# Patient Record
Sex: Male | Born: 1950 | Race: White | Hispanic: No | Marital: Married | Smoking: Former smoker
Health system: Southern US, Community
[De-identification: ages and names within clinical notes are randomized; demographics above are authoritative.]

## PROBLEM LIST (undated history)

## (undated) DIAGNOSIS — S0300XA Dislocation of jaw, unspecified side, initial encounter: Secondary | ICD-10-CM

## (undated) DIAGNOSIS — I10 Essential (primary) hypertension: Secondary | ICD-10-CM

## (undated) DIAGNOSIS — M1990 Unspecified osteoarthritis, unspecified site: Secondary | ICD-10-CM

## (undated) DIAGNOSIS — E785 Hyperlipidemia, unspecified: Secondary | ICD-10-CM

## (undated) DIAGNOSIS — H9319 Tinnitus, unspecified ear: Secondary | ICD-10-CM

## (undated) DIAGNOSIS — K219 Gastro-esophageal reflux disease without esophagitis: Secondary | ICD-10-CM

## (undated) HISTORY — PX: EYE SURGERY: SHX253

## (undated) HISTORY — PX: TONSILECTOMY, ADENOIDECTOMY, BILATERAL MYRINGOTOMY AND TUBES: SHX2538

## (undated) HISTORY — DX: Tinnitus, unspecified ear: H93.19

## (undated) HISTORY — PX: TONSILLECTOMY: SUR1361

## (undated) HISTORY — DX: Essential (primary) hypertension: I10

## (undated) HISTORY — DX: Unspecified osteoarthritis, unspecified site: M19.90

## (undated) HISTORY — PX: JOINT REPLACEMENT: SHX530

## (undated) HISTORY — DX: Gastro-esophageal reflux disease without esophagitis: K21.9

## (undated) HISTORY — DX: Dislocation of jaw, unspecified side, initial encounter: S03.00XA

## (undated) HISTORY — DX: Hyperlipidemia, unspecified: E78.5

---

## 2007-07-30 ENCOUNTER — Ambulatory Visit: Payer: Self-pay | Admitting: Family Medicine

## 2007-07-30 DIAGNOSIS — I10 Essential (primary) hypertension: Secondary | ICD-10-CM | POA: Insufficient documentation

## 2007-07-30 DIAGNOSIS — K219 Gastro-esophageal reflux disease without esophagitis: Secondary | ICD-10-CM | POA: Insufficient documentation

## 2007-08-07 ENCOUNTER — Encounter: Payer: Self-pay | Admitting: Family Medicine

## 2007-08-08 ENCOUNTER — Encounter: Payer: Self-pay | Admitting: Family Medicine

## 2007-08-08 DIAGNOSIS — E785 Hyperlipidemia, unspecified: Secondary | ICD-10-CM | POA: Insufficient documentation

## 2007-08-08 LAB — CONVERTED CEMR LAB
AST: 30 units/L (ref 0–37)
Albumin: 4.5 g/dL (ref 3.5–5.2)
Alkaline Phosphatase: 77 units/L (ref 39–117)
BUN: 16 mg/dL (ref 6–23)
HDL goal, serum: 40 mg/dL
HDL: 37 mg/dL — ABNORMAL LOW (ref >39)
LDL Cholesterol: 142 mg/dL — ABNORMAL HIGH (ref 0–99)
Potassium: 4.2 meq/L (ref 3.5–5.3)
TSH: 1.607 microintl units/mL (ref 0.350–5.50)
Total Bilirubin: 0.4 mg/dL (ref 0.3–1.2)
Total CHOL/HDL Ratio: 5.9
Triglycerides: 192 mg/dL — ABNORMAL HIGH (ref <150)
VLDL: 38 mg/dL (ref 0–40)

## 2007-08-12 ENCOUNTER — Telehealth: Payer: Self-pay | Admitting: Family Medicine

## 2007-08-29 ENCOUNTER — Telehealth: Payer: Self-pay | Admitting: Family Medicine

## 2007-08-31 LAB — HM COLONOSCOPY: HM Colonoscopy: NORMAL

## 2007-09-10 ENCOUNTER — Encounter: Payer: Self-pay | Admitting: Family Medicine

## 2008-02-05 ENCOUNTER — Ambulatory Visit: Payer: Self-pay | Admitting: Family Medicine

## 2008-03-17 ENCOUNTER — Ambulatory Visit: Payer: Self-pay | Admitting: Family Medicine

## 2008-03-17 DIAGNOSIS — M25519 Pain in unspecified shoulder: Secondary | ICD-10-CM | POA: Insufficient documentation

## 2008-03-29 ENCOUNTER — Encounter: Payer: Self-pay | Admitting: Family Medicine

## 2008-04-30 ENCOUNTER — Telehealth: Payer: Self-pay | Admitting: Family Medicine

## 2008-05-11 ENCOUNTER — Encounter: Payer: Self-pay | Admitting: Family Medicine

## 2008-06-16 ENCOUNTER — Ambulatory Visit: Payer: Self-pay | Admitting: Family Medicine

## 2008-06-17 ENCOUNTER — Encounter: Payer: Self-pay | Admitting: Family Medicine

## 2008-06-17 ENCOUNTER — Encounter: Admission: RE | Admit: 2008-06-17 | Discharge: 2008-06-17 | Payer: Self-pay | Admitting: Family Medicine

## 2008-06-21 LAB — CONVERTED CEMR LAB
BUN: 16 mg/dL (ref 6–23)
CO2: 24 meq/L (ref 19–32)
Calcium: 9.6 mg/dL (ref 8.4–10.5)
Chloride: 104 meq/L (ref 96–112)
Cholesterol: 152 mg/dL (ref 0–200)
Creatinine, Ser: 1.07 mg/dL (ref 0.40–1.50)
Glucose, Bld: 108 mg/dL — ABNORMAL HIGH (ref 70–99)
Total Bilirubin: 0.4 mg/dL (ref 0.3–1.2)
Total CHOL/HDL Ratio: 3.2
Triglycerides: 136 mg/dL (ref <150)
VLDL: 27 mg/dL (ref 0–40)

## 2008-06-23 ENCOUNTER — Encounter: Payer: Self-pay | Admitting: Family Medicine

## 2008-06-30 ENCOUNTER — Encounter: Admission: RE | Admit: 2008-06-30 | Discharge: 2008-07-21 | Payer: Self-pay | Admitting: Sports Medicine

## 2008-10-16 ENCOUNTER — Ambulatory Visit: Payer: Self-pay | Admitting: Family Medicine

## 2008-10-16 DIAGNOSIS — J069 Acute upper respiratory infection, unspecified: Secondary | ICD-10-CM | POA: Insufficient documentation

## 2008-10-16 DIAGNOSIS — R059 Cough, unspecified: Secondary | ICD-10-CM | POA: Insufficient documentation

## 2008-10-16 DIAGNOSIS — I10 Essential (primary) hypertension: Secondary | ICD-10-CM | POA: Insufficient documentation

## 2008-10-16 DIAGNOSIS — R05 Cough: Secondary | ICD-10-CM

## 2009-02-24 ENCOUNTER — Ambulatory Visit: Payer: Self-pay | Admitting: Family Medicine

## 2009-03-01 ENCOUNTER — Encounter: Payer: Self-pay | Admitting: Family Medicine

## 2009-03-03 LAB — CONVERTED CEMR LAB
AST: 33 units/L (ref 0–37)
Albumin: 4.1 g/dL (ref 3.5–5.2)
Alkaline Phosphatase: 62 units/L (ref 39–117)
BUN: 16 mg/dL (ref 6–23)
Calcium: 9.2 mg/dL (ref 8.4–10.5)
Creatinine, Ser: 1.01 mg/dL (ref 0.40–1.50)
Glucose, Bld: 103 mg/dL — ABNORMAL HIGH (ref 70–99)
HDL: 34 mg/dL — ABNORMAL LOW (ref >39)
LDL Cholesterol: 121 mg/dL — ABNORMAL HIGH (ref 0–99)
PSA: 1.04 ng/mL (ref 0.10–4.00)
Potassium: 4.9 meq/L (ref 3.5–5.3)
Total CHOL/HDL Ratio: 5.7
Triglycerides: 188 mg/dL — ABNORMAL HIGH (ref <150)

## 2010-06-05 ENCOUNTER — Ambulatory Visit: Payer: Self-pay | Admitting: Emergency Medicine

## 2010-06-05 DIAGNOSIS — J209 Acute bronchitis, unspecified: Secondary | ICD-10-CM | POA: Insufficient documentation

## 2010-11-13 ENCOUNTER — Encounter: Payer: Self-pay | Admitting: Family Medicine

## 2010-11-21 NOTE — Assessment & Plan Note (Signed)
Summary: Cough-creamy, runny nose, chest congestion x 3 dys rm 3   Vital Signs:  Patient Profile:   60 Years Old Male CC:      Cold & URI symptoms Height:     76 inches Weight:      227 pounds O2 Sat:      98 % O2 treatment:    Room Air Temp:     98.6 degrees F oral Pulse rate:   68 / minute Pulse rhythm:   regular Resp:     16 per minute BP sitting:   154 / 90  (left arm) Cuff size:   regular  Vitals Entered By: Areta Haber CMA (June 05, 2010 3:37 PM)                  Current Allergies: DEMEROL ZESTORETIC (LISINOPRIL-HYDROCHLOROTHIAZIDE)History of Present Illness Chief Complaint: Cold & URI symptoms History of Present Illness: Chest cold for 3 days.  Was recently on a business trip in [place] in very hot weather.  Chest congestion, yellow sputum, only a very mild URI symptoms, no F/C/N/V.  +cough.  He has a h/o CAP about 3 years ago tx outpt with ABX.  He has also been working a lot these past 2 weeks.  Current Problems: BRONCHITIS, ACUTE (ICD-466.0) HEALTH MAINTENANCE EXAM (ICD-V70.0) URI (ICD-465.9) COUGH (ICD-786.2) HYPERTENSION (ICD-401.9) SHOULDER PAIN, RIGHT (ICD-719.41) HYPERLIPIDEMIA NEC/NOS (ICD-272.4) GERD (ICD-530.81) HYPERTENSION, BENIGN ESSENTIAL (ICD-401.1) SCREENING FOR MALIGNANT NEOPLASM, COLON (ICD-V76.51)   Current Meds PRILOSEC OTC 20 MG  TBEC (OMEPRAZOLE MAGNESIUM)  FISH OIL 1000 MG  CAPS (OMEGA-3 FATTY ACIDS) Take 1 tablet by mouth three times a day BENICAR 20 MG TABS (OLMESARTAN MEDOXOMIL) Take 1 tablet by mouth once a day FLAX SEED OIL 1000 MG CAPS (FLAXSEED (LINSEED))  MULTIVITAMINS  TABS (MULTIPLE VITAMIN) one a day MUCINEX 600 MG XR12H-TAB (GUAIFENESIN) as directed ZITHROMAX Z-PAK 250 MG TABS (AZITHROMYCIN) use as directed TESSALON 200 MG CAPS (BENZONATATE) 1 tab by mouth three times a day as needed for cough  REVIEW OF SYSTEMS Constitutional Symptoms      Denies fever, chills, night sweats, weight loss, weight gain, and  fatigue.  Eyes       Denies change in vision, eye pain, eye discharge, glasses, contact lenses, and eye surgery. Ear/Nose/Throat/Mouth       Complains of frequent runny nose.      Denies hearing loss/aids, change in hearing, ear pain, ear discharge, dizziness, frequent nose bleeds, sinus problems, sore throat, hoarseness, and tooth pain or bleeding.  Respiratory       Complains of productive cough.      Denies dry cough, wheezing, shortness of breath, asthma, bronchitis, and emphysema/COPD.      Comments: chest congestion Cardiovascular       Denies murmurs, chest pain, and tires easily with exhertion.    Gastrointestinal       Denies stomach pain, nausea/vomiting, diarrhea, constipation, blood in bowel movements, and indigestion. Genitourniary       Denies painful urination, kidney stones, and loss of urinary control. Neurological       Denies paralysis, seizures, and fainting/blackouts. Musculoskeletal       Denies muscle pain, joint pain, joint stiffness, decreased range of motion, redness, swelling, muscle weakness, and gout.  Skin       Denies bruising, unusual mles/lumps or sores, and hair/skin or nail changes.  Psych       Denies mood changes, temper/anger issues, anxiety/stress, speech problems, depression, and sleep problems. Other Comments: creamy  x 3 dys. Pt states he's been out of town for a week as well. Pt has not seen his PCP for this.   Past History:  Past Medical History: Last updated: 02/24/2009 Hx of TMJ with jaw dislocation.  Chronic ear ringing for 20 years.  Hypertension borderline choleterol  Spirometry 02/2009: FVC 77%, FEV1 74%, FEV1% is 78%, FEV1 inc by 15%, FEF 25-75 inc by 48%.    Past Surgical History: Last updated: 07/30/2007 tonsillectomy as a child.   Family History: Last updated: 10/16/2008 Mother with HTN, and cholesterol. Also had congenital kidney disease that let dialysis in her 32s.  Passed in her 20s from complications of kidney dz.     father alive - bypass in 2000  Social History: Last updated: 10/16/2008 Occupational hygienist for [employer].  Married to [place] with 3 adult children.  Youngest daughter still lives at home.  Former Smoker - smoked for 3 years and stopped at age 60 Alcohol use-yes - 12 drinks per week Drug use-no Regular exercise-no  Risk Factors: Alcohol Use: 2 (07/30/2007) Caffeine Use: 2 (07/30/2007) Exercise: no (07/30/2007)  Risk Factors: Smoking Status: quit (07/30/2007)  Physical Exam General appearance: well developed, well nourished, no acute distress Ears: normal, no lesions or deformities Nasal: mucosa pink, nonedematous, no septal deviation, turbinates normal Oral/Pharynx: tongue normal, posterior pharynx without erythema or exudate Chest/Lungs: no rales, wheezes, or rhonchi bilateral, breath sounds equal without effort Heart: regular rate and  rhythm, no murmur Skin: no obvious rashes or lesions MSE: oriented to time, place, and person Assessment New Problems: BRONCHITIS, ACUTE (ICD-466.0)   Plan New Medications/Changes: TESSALON 200 MG CAPS (BENZONATATE) 1 tab by mouth three times a day as needed for cough  #21 x 0, 06/05/2010, Hoyt Koch MD ZITHROMAX Z-PAK 250 MG TABS (AZITHROMYCIN) use as directed  #1 x 0, 06/05/2010, Hoyt Koch MD  New Orders: Est. Patient Level II [phone] Planning Comments:   Mucinex OTC twice a day, Increase water intake, If getting more severe, Follow-up with your primary care physician for possible chest Xray.  Also, Follow-up with your primary care physician to discuss your high blood pressure.  You should go check it at the pharmacy occasionally and write down the numbers so that you can show Dr. Darra Lis in case you need to restart your BP meds.   The patient and/or caregiver has been counseled thoroughly with regard to medications prescribed including dosage, schedule, interactions, rationale for use, and possible side effects and they  verbalize understanding.  Diagnoses and expected course of recovery discussed and will return if not improved as expected or if the condition worsens. Patient and/or caregiver verbalized understanding.  Prescriptions: TESSALON 200 MG CAPS (BENZONATATE) 1 tab by mouth three times a day as needed for cough  #21 x 0   Entered and Authorized by:   Hoyt Koch MD   Signed by:   Hoyt Koch MD on 06/05/2010   Method used:   Print then Give to Patient   RxID:   [id number] ZITHROMAX Z-PAK 250 MG TABS (AZITHROMYCIN) use as directed  #1 x 0   Entered and Authorized by:   Hoyt Koch MD   Signed by:   Hoyt Koch MD on 06/05/2010   Method used:   Print then Give to Patient   RxID:   [id number]   Orders Added: 1)  Est. Patient Level II [29528]

## 2011-03-07 ENCOUNTER — Encounter: Payer: Self-pay | Admitting: Family Medicine

## 2011-03-07 ENCOUNTER — Ambulatory Visit (INDEPENDENT_AMBULATORY_CARE_PROVIDER_SITE_OTHER): Payer: 59 | Admitting: Family Medicine

## 2011-03-07 VITALS — BP 157/97 | HR 74 | Ht 74.5 in | Wt 228.0 lb

## 2011-03-07 DIAGNOSIS — R209 Unspecified disturbances of skin sensation: Secondary | ICD-10-CM

## 2011-03-07 DIAGNOSIS — R202 Paresthesia of skin: Secondary | ICD-10-CM

## 2011-03-07 DIAGNOSIS — M79609 Pain in unspecified limb: Secondary | ICD-10-CM

## 2011-03-07 DIAGNOSIS — M546 Pain in thoracic spine: Secondary | ICD-10-CM

## 2011-03-07 MED ORDER — OLMESARTAN MEDOXOMIL 20 MG PO TABS: 20.0000 mg | ORAL_TABLET | Freq: Every day | ORAL | Status: DC

## 2011-03-07 NOTE — Progress Notes (Signed)
  Subjective:    Patient ID: Glenn Meyer, male    DOB: 10-02-1951, 60 y.o.   MRN: [id number]  HPI Neat the left shoulder blade had a twinge while out of town in [place].  Woke up started next morning to stretch and thin intense pain that brought him to his knees. Says started sweating with pain.  Saw an doc there.  Told it was muscle spasm and given a shot.  24 hours later says he is numb from his inner elbow to his inner  wrist and the 4th and 5th digit on the left hand are numb as well.  Also notes dec strength in the hand as well.  Was on hydrocodone initially. He is on an anti-inflammatory.  His actual back pain is much better. No trauma to upper back. No prior injuries.   Stopped his BP meds. He stopped his blood pressure meds because he been monitoring his blood pressure and it has been well-controlled. He has been off of them for almost a year. He did not realize that his blood pressure had started to creep back up. No chest pain or shortness of breath. He did not have any side effects or problems with his last medication. Review of Systems     Objective:   Physical Exam  Constitutional: He appears well-developed and well-nourished.  HENT:  Head: Normocephalic and atraumatic.  Cardiovascular: Normal rate, regular rhythm and normal heart sounds.   Pulmonary/Chest: Effort normal and breath sounds normal.  Musculoskeletal:       Neck with NROM. Mild tenderness along the medial border of the scapula.  Left shoulder with NROM.  Strength 5/5in the shoulder, elbow, and wrist.  Neg tinnels sign and neg phalens though he did get pain in the elbow while doing phalens.  Dec senstaion in the medial forearm compared the right arm. Finger strength is 5/5  Psychiatric: He has a normal mood and affect.          Assessment & Plan:  Thoracic back pain-I do think he has got some near the shoulder blade. That seems significantly improved. He still is mildly tender on exam but I think  anti-inflammatories helping. I am not sure how this is necessarily related to the numbness he is feeling in his forearm. The numbness is in the location of T1. It is strange as this is not where he is actually having his pain in his back. Thus it makes it more difficult to ascertain whether or not he may have an impingement of the nerve at T1 or if it is located in the shoulder or the elbow. I do not think he has carpal tunnel. At this point I explained to him because of the unusual findings I would recommend he see either sports medicine or an orthopedist for further evaluation. We will see if we can get him an appointment next week. In the meantime would like him to continue his anti-inflammatories and he is getting significant benefit from this at least for his back pain. If he feels his numbness or weakness in his hand suddenly gets worse he complains call the office.

## 2011-04-01 ENCOUNTER — Encounter: Payer: Self-pay | Admitting: Family Medicine

## 2011-04-04 ENCOUNTER — Ambulatory Visit: Payer: 59 | Admitting: Family Medicine

## 2011-09-26 ENCOUNTER — Encounter: Payer: Self-pay | Admitting: Emergency Medicine

## 2011-09-26 ENCOUNTER — Emergency Department
Admission: EM | Admit: 2011-09-26 | Discharge: 2011-09-26 | Disposition: A | Discharge status: Home / Self Care | Payer: 59 | Source: Home / Self Care

## 2011-09-26 DIAGNOSIS — Z23 Encounter for immunization: Secondary | ICD-10-CM

## 2011-09-26 MED ORDER — INFLUENZA VAC TYP A&B SURF ANT IM INJ
0.5000 mL | INJECTION | Freq: Once | INTRAMUSCULAR | Status: AC
  Administered 2011-09-26: 0.5 mL via INTRAMUSCULAR

## 2011-09-26 NOTE — ED Notes (Signed)
Flu shot

## 2011-10-04 ENCOUNTER — Other Ambulatory Visit: Payer: Self-pay | Admitting: Family Medicine

## 2011-10-05 ENCOUNTER — Other Ambulatory Visit: Payer: Self-pay | Admitting: Family Medicine

## 2012-05-09 ENCOUNTER — Encounter: Payer: Self-pay | Admitting: Family Medicine

## 2012-05-09 ENCOUNTER — Ambulatory Visit (INDEPENDENT_AMBULATORY_CARE_PROVIDER_SITE_OTHER): Payer: 59 | Admitting: Family Medicine

## 2012-05-09 VITALS — BP 129/85 | HR 62 | Ht 74.5 in | Wt 234.0 lb

## 2012-05-09 DIAGNOSIS — M76899 Other specified enthesopathies of unspecified lower limb, excluding foot: Secondary | ICD-10-CM

## 2012-05-09 DIAGNOSIS — I10 Essential (primary) hypertension: Secondary | ICD-10-CM

## 2012-05-09 DIAGNOSIS — M7060 Trochanteric bursitis, unspecified hip: Secondary | ICD-10-CM

## 2012-05-09 DIAGNOSIS — K219 Gastro-esophageal reflux disease without esophagitis: Secondary | ICD-10-CM

## 2012-05-09 MED ORDER — OLMESARTAN MEDOXOMIL 20 MG PO TABS: 20.0000 mg | ORAL_TABLET | Freq: Every day | ORAL | Status: DC

## 2012-05-09 NOTE — Patient Instructions (Signed)

## 2012-05-09 NOTE — Progress Notes (Signed)
  Subjective:    Patient ID: Glenn Meyer, male    DOB: [birth year], 61 y.o.   MRN: [id number]  HPI HTN- No CP or SOB.  No SE with medication. He takes his medications regularly.  GERD - Was taking prilosec but heard it was not good to take for a long time.  Started drinking cider vinegar. He says it did help but was still having some symptoms to start over-the-counter Zantac at bedtime and says that has helped. He just wants to make sure that it's safe to continue this.  He has been having some pain on the side of his left hip he. He went to see an orthopedist and they did an x-ray and told him that the x-ray looked great. They recommended an MRI for further evaluation but was going to call $700 he decided not to do it. He says it mostly bothers him when he lays on that side at night to sleep. If he sleeps on his back it actually feels better. He also has a history of a little bit of bursitis in his left shoulder.  Review of Systems     Objective:   Physical Exam  Constitutional: He is oriented to person, place, and time. He appears well-developed and well-nourished.  HENT:  Head: Normocephalic and atraumatic.  Cardiovascular: Normal rate, regular rhythm and normal heart sounds.        Her carotid bruits.   Pulmonary/Chest: Effort normal and breath sounds normal.  Musculoskeletal:       Left hip with normal range of motion he has weak abductors on the left. He is mildly tender over the posterior edge of the greater trochanter.  Neurological: He is alert and oriented to person, place, and time.  Skin: Skin is warm and dry.  Psychiatric: He has a normal mood and affect. His behavior is normal.          Assessment & Plan:  HTN-Well controlled. Refilled meds. Due CMP nad lipids. Given lab slip today.  F/U in 6 months.   GERD-Ok to use the zantac, Warned about inc fracture risk on PPIs long term. He can increase the Zantac to twice a day if needs to 4 increase in symptoms. Encouraged  him to stay away from acidic foods. He can also restart omeprazole or Nexium if needed but encouraged him to use for short period time and then wean.  Left trochanteric bursitis - Discussed condition.  He also has weak adductors so encouraged him to work on strethening this. Also given H.O on home stretches.  If he is not getting better then we can consider a injection into the bursa.He can use Aleve or IBU prn and ice as needed.

## 2012-05-14 LAB — COMPLETE METABOLIC PANEL WITH GFR
ALT: 24 U/L (ref 0–53)
Alkaline Phosphatase: 72 U/L (ref 39–117)
GFR, Est Non African American: 71 mL/min
Sodium: 138 mEq/L (ref 135–145)
Total Bilirubin: 0.5 mg/dL (ref 0.3–1.2)
Total Protein: 6.8 g/dL (ref 6.0–8.3)

## 2012-05-14 LAB — LIPID PANEL
HDL: 38 mg/dL — ABNORMAL LOW (ref >39)
LDL Cholesterol: 112 mg/dL — ABNORMAL HIGH (ref 0–99)
Total CHOL/HDL Ratio: 5 Ratio

## 2012-08-20 ENCOUNTER — Encounter: Payer: Self-pay | Admitting: Family Medicine

## 2012-08-20 ENCOUNTER — Ambulatory Visit (INDEPENDENT_AMBULATORY_CARE_PROVIDER_SITE_OTHER): Payer: 59 | Admitting: Family Medicine

## 2012-08-20 VITALS — BP 138/85 | HR 66 | Ht 76.0 in | Wt 232.0 lb

## 2012-08-20 DIAGNOSIS — IMO0001 Reserved for inherently not codable concepts without codable children: Secondary | ICD-10-CM

## 2012-08-20 DIAGNOSIS — M7918 Myalgia, other site: Secondary | ICD-10-CM

## 2012-08-20 DIAGNOSIS — F430 Acute stress reaction: Secondary | ICD-10-CM

## 2012-08-20 DIAGNOSIS — Z23 Encounter for immunization: Secondary | ICD-10-CM

## 2012-08-20 MED ORDER — SERTRALINE HCL 50 MG PO TABS: ORAL_TABLET | ORAL | Status: DC

## 2012-08-20 NOTE — Progress Notes (Signed)
  Subjective:    Patient ID: Glenn Meyer, male    DOB: [birth year], 61 y.o.   MRN: [id number]  HPI Last 2 months feels edgy, very inpatient, and irritable. Has been very stressed at work and at home. Daughter moved back in.  Says there were short of staff at work.  Had to put his farther in a nursing home.  He denies actually feeling depressed. If anything he just feels more irritable and anxious. Sleep is fair. No major changes there. No prior history of major mood disorder.  Trochanteric bursitis - I saw him a couple months ago for what I felt was trochanteric bursitis. He has been doing some exercises. Never really bothers him during the day but if sleeps on that side says it wakes him up 2-3 times at night  Has been taking melatonin and valerian root for sleep.  Not really taking any medications for his pain.  Review of Systems     Objective:   Physical Exam  Constitutional: He is oriented to person, place, and time. He appears well-developed and well-nourished.  HENT:  Head: Normocephalic and atraumatic.  Cardiovascular: Normal rate, regular rhythm and normal heart sounds.   Pulmonary/Chest: Effort normal and breath sounds normal.  Musculoskeletal:       But with normal range of motion. He is actually not tender over the greater trochanter today. He's actually more tender over the location of the piriformis muscle. The he denies any pain radiating down to his thigh or leg.  Neurological: He is alert and oriented to person, place, and time.  Skin: Skin is warm and dry.  Psychiatric: He has a normal mood and affect. His behavior is normal.          Assessment & Plan:  Acute stress - GAD- 7 score of 19.  Discussed options of medication vs therapy. He says he is not interested at all and there appeared counseling. He says he really doesn't see how that would be helpful for him at all. Though I explained to him he's been through a lot of stressful things in the last couple months and  certainly that could be triggering his mood. He feels like it's probably a chemical and balance and would like to consider the option of the medication. Thus we discussed starting sertraline and potential side effects and risks of the medication. Also discussed how it works and that often times takes 3-4 weeks to start working. I would like to see him back in a month to make sure he is tolerating it well and to adjust his medication as needed.  Left buttock pain- this is not consistant with trochanteric bursitis. 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 I would like to schedule him with nonproductive [facility] for further evaluation and treatment. Patient says is really not interested in physical therapy at this time.  Flu shot- Given today.   Shingles vaccine- he is interested in getting her shingles vaccine. I did encourage him to check with his insurance company because it is expensive to make sure that it's covered. We will be happy to give it to him at any time  Not due for penumonia vaccine until age 57, since no other risk factors.   25 min spent, > 50% spent in cousneling.

## 2012-08-20 NOTE — Patient Instructions (Addendum)
Piriformis Syndrome  Piriformis syndrome is a rare neuromuscular disorder. It happens when the piriformis muscle compresses or irritates the sciatic nerve. This is the largest nerve in the body. The piriformis muscle is a narrow muscle. It is located in the buttocks.   SYMPTOMS   Compression of the sciatic nerve causes pain. It is often described as tingling or numbness:  · In the buttocks.  · Along the nerve.  · Down to the leg.  The pain may get worse as a result of:  · Sitting for a long period of time.  · Climbing stairs.  · Walking or running.  TREATMENT   Generally, treatment for the disorder begins with stretching exercises and massage. Anti-inflammatory drugs may be prescribed. A patient may be advised to stop running, bicycling, or similar activities. A corticosteroid injection near where the piriformis muscle and the sciatic nerve meet may provide temporary relief. In some cases, surgery is recommended.  The outcome for most individuals with this syndrome is good. Once symptoms of the disorder are addressed, individuals can usually resume their normal activities. In some cases, exercise regimens may need to be modified. This is in order to reduce the likelihood of recurrence or worsening.  Document Released: 09/28/2002 Document Revised: 04/08/2012 Document Reviewed: 10/08/2005  [employer]® Patient Information ©2013 [employer]

## 2012-10-01 ENCOUNTER — Institutional Professional Consult (permissible substitution): Payer: 59 | Admitting: Sports Medicine

## 2012-10-03 ENCOUNTER — Encounter: Payer: Self-pay | Admitting: Sports Medicine

## 2012-10-03 ENCOUNTER — Ambulatory Visit (INDEPENDENT_AMBULATORY_CARE_PROVIDER_SITE_OTHER): Payer: 59 | Admitting: Sports Medicine

## 2012-10-03 ENCOUNTER — Ambulatory Visit (INDEPENDENT_AMBULATORY_CARE_PROVIDER_SITE_OTHER): Payer: 59

## 2012-10-03 VITALS — BP 132/80 | HR 63 | Wt 235.0 lb

## 2012-10-03 DIAGNOSIS — M7918 Myalgia, other site: Secondary | ICD-10-CM

## 2012-10-03 DIAGNOSIS — IMO0001 Reserved for inherently not codable concepts without codable children: Secondary | ICD-10-CM

## 2012-10-03 DIAGNOSIS — M169 Osteoarthritis of hip, unspecified: Secondary | ICD-10-CM | POA: Insufficient documentation

## 2012-10-03 DIAGNOSIS — R109 Unspecified abdominal pain: Secondary | ICD-10-CM

## 2012-10-03 DIAGNOSIS — M1610 Unilateral primary osteoarthritis, unspecified hip: Secondary | ICD-10-CM

## 2012-10-03 MED ORDER — MELOXICAM 15 MG PO TABS: ORAL_TABLET | ORAL | Status: DC

## 2012-10-03 NOTE — Assessment & Plan Note (Addendum)
I can reproduce this with both extreme hip internal rotation, as well as resisted abduction. This is a combination of hip degenerative joint disease, as well as hip abductor tendinosis. X-rays. We are going to start conservatively with Mobic, and some very specific hip abductor rehabilitation exercises. I like to see him back in about 3-4 weeks, and if no better we are going to do an intra-articular injection into his femoral acetabular joint on the left side.

## 2012-10-03 NOTE — Patient Instructions (Addendum)
Hip Rehabilitation Protocol:  1.  Side leg raises.  3x30 with no weight, then 3x15 with 2 lb ankle weight, then 3x15 with 5 lb ankle weight 2.  Standing hip rotation.  3x30 with no weight, then 3x15 with 2 lb ankle weight, then 3x15 with 5 lb ankle weight. 3.  Side step ups.  3x30 with no weight, then 3x15 with 5 lbs in backpack, then 3x15 with 10 lbs in backpack. 

## 2012-10-03 NOTE — Progress Notes (Signed)
SPORTS MEDICINE CONSULTATION REPORT  Subjective:    I'm seeing this patient as a consultation for:  Dr. Linford Arnold  CC: Left buttock pain  HPI: This is a very pleasant 61 year old male with a several month history of pain he localizes deep in his left buttock. It's worse when sleeping, and worse with weightbearing. The pain is localized, and does not radiate.  He has had some rehabilitation, and treatment for trochanteric bursitis, but this has not helped much. He denies any trauma, denies any constitutional symptoms.  Past medical history, Surgical history, Family history, Social history, Allergies, and medications have been entered into the medical record, reviewed, and no changes needed.   Review of Systems: No headache, visual changes, nausea, vomiting, diarrhea, constipation, dizziness, abdominal pain, skin rash, fevers, chills, night sweats, weight loss, swollen lymph nodes, body aches, joint swelling, muscle aches, chest pain, shortness of breath, mood changes, visual or auditory hallucinations.   Objective:   Vitals:  Afebrile, vital signs stable. General: Well Developed, well nourished, and in no acute distress.  Neuro/Psych: Alert and oriented x3, extra-ocular muscles intact, able to move all 4 extremities.  Skin: Warm and dry, no rashes noted.  Respiratory: Not using accessory muscles, speaking in full sentences, trachea midline.  Cardiovascular: Pulses palpable, no extremity edema. Abdomen: Does not appear distended. Back Exam:  Inspection: Unremarkable  Motion: Flexion 45 deg, Extension 45 deg, Side Bending to 45 deg bilaterally,  Rotation to 45 deg bilaterally  SLR laying: Negative  XSLR laying: Negative  Palpable tenderness: None. FABER: negative. Sensory change: Gross sensation intact to all lumbar and sacral dermatomes.  Reflexes: 2+ at both patellar tendons, 2+ at achilles tendons, Babinski's downgoing.  Strength at foot  Plantar-flexion: 5/5 Dorsi-flexion: 5/5  Eversion: 5/5 Inversion: 5/5  Leg strength  Quad: 5/5 Hamstring: 5/5 Hip flexor: 5/5 Hip abductors: 5/5  Gait unremarkable.  Left Hip: ROM IR: 45 Deg, ER: 45 Deg, Flexion: 120 Deg, Extension: 100 Deg, Abduction: 45 Deg, Adduction: 45 Deg Strength IR: 5/5, ER: 5/5, Flexion: 5/5, Extension: 5/5, Abduction: 5/5, Adduction: 5/5 He has reproduction of pain he localizes deep in the left groin, radiating deep into the posterior buttock with extreme internal rotation of the left hip. This is highly suggestive of intra-articular hip pathology. I can also reproduce pain with resisted hip abduction.  Pelvic alignment unremarkable to inspection and palpation. Standing hip rotation and gait without trendelenburg sign / unsteadiness. Greater trochanter without tenderness to palpation. No tenderness over piriformis and greater trochanter. No pain with FABER or FADIR. No SI joint tenderness and normal minimal SI movement.  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   Impression and Recommendations:   This case required medical decision making of moderate complexity.

## 2012-10-06 ENCOUNTER — Other Ambulatory Visit: Payer: Self-pay | Admitting: *Deleted

## 2012-10-06 MED ORDER — ESOMEPRAZOLE MAGNESIUM 40 MG PO CPDR: DELAYED_RELEASE_CAPSULE | ORAL | Status: DC

## 2012-10-06 MED ORDER — AMBULATORY NON FORMULARY MEDICATION: Status: DC

## 2012-10-06 NOTE — Telephone Encounter (Signed)
Haha, ok, rx in my outbox, and nexium sent.  There is no guarantee just writing a rx will help but I'm sure he knows that.

## 2012-10-06 NOTE — Telephone Encounter (Signed)
Pt.notified

## 2012-10-06 NOTE — Telephone Encounter (Signed)
Pt calls and states you were gonna send in rx for his Nexium 40mg  to [facility] but it was not there when went to pick up the Mobic. Asked if this could be sent over to pharmacy. Also said you and him had discussed getting an eliptical trainer to help with conditioning and arthritis in hip. Pt would like a rx for this as well so he can claim as medical expense on his taxes. Will pick up the rx for eliptical trainer- call when ready

## 2012-10-07 ENCOUNTER — Other Ambulatory Visit: Payer: Self-pay | Admitting: Sports Medicine

## 2012-10-07 MED ORDER — DEXLANSOPRAZOLE 60 MG PO CPDR: 60.0000 mg | DELAYED_RELEASE_CAPSULE | Freq: Every day | ORAL | Status: DC

## 2012-10-08 ENCOUNTER — Encounter: Payer: Self-pay | Admitting: Family Medicine

## 2012-10-08 ENCOUNTER — Ambulatory Visit (INDEPENDENT_AMBULATORY_CARE_PROVIDER_SITE_OTHER): Payer: 59 | Admitting: Family Medicine

## 2012-10-08 VITALS — BP 116/73 | HR 64 | Ht 76.0 in | Wt 234.0 lb

## 2012-10-08 DIAGNOSIS — F430 Acute stress reaction: Secondary | ICD-10-CM

## 2012-10-08 DIAGNOSIS — E785 Hyperlipidemia, unspecified: Secondary | ICD-10-CM

## 2012-10-08 DIAGNOSIS — Z23 Encounter for immunization: Secondary | ICD-10-CM

## 2012-10-08 DIAGNOSIS — M25559 Pain in unspecified hip: Secondary | ICD-10-CM

## 2012-10-08 DIAGNOSIS — I10 Essential (primary) hypertension: Secondary | ICD-10-CM

## 2012-10-08 DIAGNOSIS — Z2911 Encounter for prophylactic immunotherapy for respiratory syncytial virus (RSV): Secondary | ICD-10-CM

## 2012-10-08 NOTE — Progress Notes (Signed)
  Subjective:    Patient ID: Glenn Meyer, male    DOB: 07-03-1951, 61 y.o.   MRN: [id number]  HPI He decided not to take the sertraline.  He decided to take a herbal mixture and feels his mood is better. Says he is still working 70 hours per week and is doing week.    HTN - Pt denies chest pain, SOB, dizziness, or heart palpitations.  Taking meds as directed w/o problems.  Denies medication side effects.    Hip pain - he did see Dr. 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 He did have x-rays on Friday and was curious about the results. He does have a followup in the next week or 2 with him. He has been taking anti-inflammatory. He wanted note this was in long-term treatment.  GERD - given a prescription for dexilant because it was the most $45 per month. He said he says that his over-the-counter Prilosec. He was just concerned because a settle the box not to take for more than 14 days.   Review of Systems     Objective:   Physical Exam  Constitutional: He is oriented to person, place, and time. He appears well-developed and well-nourished.  HENT:  Head: Normocephalic and atraumatic.  Neck: Neck supple. No thyromegaly present.  Cardiovascular: Normal rate, regular rhythm and normal heart sounds.        No carotid bruits   Pulmonary/Chest: Effort normal and breath sounds normal.  Lymphadenopathy:    He has no cervical adenopathy.  Neurological: He is alert and oriented to person, place, and time.  Skin: Skin is warm and dry.  Psychiatric: He has a normal mood and affect. His behavior is normal.          Assessment & Plan:  Anxiety, acute situation stress - if the herbal supplement that he is taking is working well then he can certainly continue it. His blood pressure looks good on it. He has not had any other side effects. I did reassure him about the sertraline that. He was concerned because he read on the label that can cause  depression. I reassured him that this is extremely rare and typically happens in people who have other mood disorders such as bipolar et Karie Soda. He does feel the bottle and says he does have a home so certainly he can start it if he would like to.  Shingle vaccine - given today.  GERD - we discussed okay to go back on over-the-counter Prilosec. He may want to try some place like [employer] or [employer]. I did explain to him the risk of increased fractures and bone loss. Thus I would encourage him to take as little as needed. For example he start with every other day and then go to every third day if tolerated and his symptoms are still well controlled. We also again reviewed the importance of dietary measures.  Hypertension-well-controlled. Continue current regimen. Followup in 6 months.  Hip pain-I. did review the results of the x-ray with him. I did encourage him that he keep his followup with Dr. Benjamin Stain to discuss further treatment options. I did explain to him that the anti-inflammatories not a long-term treatment. It should really be only used for flares of pain.  Time spent 30 min , > 50% spent in counseling about GERD and MOOD and reviewing his xray results.

## 2012-10-24 ENCOUNTER — Ambulatory Visit: Payer: 59 | Admitting: Sports Medicine

## 2012-10-25 LAB — COMPLETE METABOLIC PANEL WITH GFR
ALT: 33 U/L (ref 0–53)
AST: 28 U/L (ref 0–37)
CO2: 30 mEq/L (ref 19–32)
Calcium: 9.4 mg/dL (ref 8.4–10.5)
Chloride: 103 mEq/L (ref 96–112)
GFR, Est African American: >89 mL/min
Potassium: 4.8 mEq/L (ref 3.5–5.3)
Sodium: 139 mEq/L (ref 135–145)
Total Protein: 6.7 g/dL (ref 6.0–8.3)

## 2012-10-25 LAB — LIPID PANEL
LDL Cholesterol: 134 mg/dL — ABNORMAL HIGH (ref 0–99)
VLDL: 36 mg/dL (ref 0–40)

## 2012-11-06 ENCOUNTER — Other Ambulatory Visit: Payer: Self-pay | Admitting: Sports Medicine

## 2012-11-12 ENCOUNTER — Ambulatory Visit: Payer: 59 | Admitting: Sports Medicine

## 2012-11-16 ENCOUNTER — Other Ambulatory Visit: Payer: Self-pay | Admitting: Family Medicine

## 2013-01-17 ENCOUNTER — Other Ambulatory Visit: Payer: Self-pay | Admitting: Family Medicine

## 2013-02-21 ENCOUNTER — Encounter: Payer: Self-pay | Admitting: *Deleted

## 2013-02-21 ENCOUNTER — Emergency Department (INDEPENDENT_AMBULATORY_CARE_PROVIDER_SITE_OTHER)
Admission: EM | Admit: 2013-02-21 | Discharge: 2013-02-21 | Disposition: A | Discharge status: Home / Self Care | Payer: 59 | Source: Home / Self Care | Attending: Family Medicine | Admitting: Family Medicine

## 2013-02-21 DIAGNOSIS — J309 Allergic rhinitis, unspecified: Secondary | ICD-10-CM

## 2013-02-21 DIAGNOSIS — J069 Acute upper respiratory infection, unspecified: Secondary | ICD-10-CM

## 2013-02-21 DIAGNOSIS — R062 Wheezing: Secondary | ICD-10-CM

## 2013-02-21 MED ORDER — FEXOFENADINE HCL 180 MG PO TABS: 180.0000 mg | ORAL_TABLET | Freq: Every day | ORAL | Status: DC

## 2013-02-21 MED ORDER — AZITHROMYCIN 250 MG PO TABS: ORAL_TABLET | ORAL | Status: DC

## 2013-02-21 MED ORDER — METHYLPREDNISOLONE ACETATE 80 MG/ML IJ SUSP
80.0000 mg | Freq: Once | INTRAMUSCULAR | Status: AC
  Administered 2013-02-21: 80 mg via INTRAMUSCULAR

## 2013-02-21 NOTE — ED Notes (Signed)
Pt c/o cough, rhinitis, congestion x 3 days. Has tried OTC Nasacort and Robitussin with no help

## 2013-02-21 NOTE — ED Provider Notes (Signed)
History     CSN: [id number]  Arrival date & time 02/21/13  1330   First MD Initiated Contact with Patient 02/21/13 1331      Chief Complaint  Patient presents with  . Nasal Congestion  . Cough   HPI  URI Symptoms Onset: 2-3 days  Description: rhinorrhea, nasal congestion, cough Modifying factors:  Prior hx/o PNA 3-4 years ago. No SOB, no fever.   Symptoms Nasal discharge: yes Fever: no Sore throat: mild Cough: yes Wheezing: no Ear pain: no GI symptoms: no Sick contacts: no  Red Flags  Stiff neck: no Dyspnea: no Rash: no Swallowing difficulty: no  Sinusitis Risk Factors Headache/face pain: no Double sickening: no tooth pain: no  Allergy Risk Factors Sneezing: yes Itchy scratchy throat: yes Seasonal symptoms: yes  Flu Risk Factors Headache: n muscle aches: no severe fatigue: no   Past Medical History  Diagnosis Date  . TMJ (dislocation of temporomandibular joint)   . Hypertension   . Tinnitus   . Ringing of ears     chronic X 20 years  . Hyperlipidemia     Past Surgical History  Procedure Laterality Date  . Tonsilectomy, adenoidectomy, bilateral myringotomy and tubes      Family History  Problem Relation Age of Onset  . Hypertension    . Hyperlipidemia    . Hypertension Mother   . Hyperlipidemia Mother   . Kidney disease Mother     congenital/diaylisis 55's  . Heart disease Father     bypass 2000    History  Substance Use Topics  . Smoking status: Former Smoker -- 3 years    Types: Cigarettes    Quit date: 10/22/1972  . Smokeless tobacco: Not on file     Comment: quit 34 years ago  . Alcohol Use: 6.0 oz/week    12 drink(s) per week     Comment: per week      Review of Systems  All other systems reviewed and are negative.    Allergies  Lisinopril-hydrochlorothiazide and Meperidine hcl  Home Medications   Current Outpatient Rx  Name  Route  Sig  Dispense  Refill  . AMBULATORY NON FORMULARY MEDICATION      Elliptical  trainer, for hip OA.   1 Units   0   . arginine 500 MG tablet   Oral   Take 500 mg by mouth daily.         Marland Kitchen BENICAR 20 MG tablet      take 1 tablet by mouth once daily   30 tablet   1     Needs to make appointment   . DEXILANT 60 MG capsule               . glucosamine-chondroitin 500-400 MG tablet   Oral   Take 1 tablet by mouth daily.         . Melatonin 5 MG TABS   Oral   Take 5 mg by mouth as needed.         . meloxicam (MOBIC) 15 MG tablet      One tab PO qAM with breakfast for 2 weeks, then daily prn pain.   30 tablet   3   . Multiple Vitamin (MULTIVITAMIN) capsule   Oral   Take 1 capsule by mouth daily.           . Omega-3 Fatty Acids (FISH OIL) 1000 MG CAPS   Oral   Take by mouth.           Marland Kitchen  sertraline (ZOLOFT) 50 MG tablet                 BP 119/71  Pulse 66  Temp(Src) 98 F (36.7 C) (Oral)  Resp 16  Ht 6\' 4"  (1.93 m)  Wt 238 lb 8 oz (108.183 kg)  BMI 29.04 kg/m2  SpO2 98%  Physical Exam  Constitutional: He appears well-developed and well-nourished.  HENT:  Head: Normocephalic and atraumatic.  Right Ear: External ear normal.  Left Ear: External ear normal.  +nasal erythema, rhinorrhea bilaterally, + post oropharyngeal erythema    Eyes: Conjunctivae are normal. Pupils are equal, round, and reactive to light.  Neck: Normal range of motion.  Cardiovascular: Normal rate and regular rhythm.   Pulmonary/Chest: Effort normal.  Faint wheezes in apices    Abdominal: Soft.  Musculoskeletal: Normal range of motion.  Neurological: He is alert.  Skin: Skin is warm.    ED Course  Procedures (including critical care time)  Labs Reviewed - No data to display No results found.   1. URI (upper respiratory infection)   2. Allergic rhinitis   3. Wheezing       MDM  Suspect viral/allergic process depomedrol 80mg  IM x1 for wheezing.  Continue nasacort. Start allegra for allergic rhinitis.  Over stable lung exam today.  No rales, increased WOB or hypoxia.  Prophylactic zpak if sxs fail to improve in 24-48 hours.  Resp red flags reviewed.  Follow up as needed.      The patient and/or caregiver has been counseled thoroughly with regard to treatment plan and/or medications prescribed including dosage, schedule, interactions, rationale for use, and possible side effects and they verbalize understanding. Diagnoses and expected course of recovery discussed and will return if not improved as expected or if the condition worsens. Patient and/or caregiver verbalized understanding.             Doree Albee, MD 02/21/13 1407

## 2013-03-21 ENCOUNTER — Other Ambulatory Visit: Payer: Self-pay | Admitting: Family Medicine

## 2013-03-24 ENCOUNTER — Other Ambulatory Visit: Payer: Self-pay | Admitting: *Deleted

## 2013-03-24 MED ORDER — OLMESARTAN MEDOXOMIL 20 MG PO TABS: 20.0000 mg | ORAL_TABLET | Freq: Every day | ORAL | Status: DC

## 2013-04-01 ENCOUNTER — Encounter: Payer: Self-pay | Admitting: Family Medicine

## 2013-04-01 ENCOUNTER — Ambulatory Visit (INDEPENDENT_AMBULATORY_CARE_PROVIDER_SITE_OTHER): Payer: 59 | Admitting: Family Medicine

## 2013-04-01 VITALS — BP 125/79 | HR 61 | Wt 232.0 lb

## 2013-04-01 DIAGNOSIS — F411 Generalized anxiety disorder: Secondary | ICD-10-CM

## 2013-04-01 DIAGNOSIS — R7309 Other abnormal glucose: Secondary | ICD-10-CM

## 2013-04-01 DIAGNOSIS — I10 Essential (primary) hypertension: Secondary | ICD-10-CM

## 2013-04-01 LAB — BASIC METABOLIC PANEL WITH GFR
CO2: 24 mEq/L (ref 19–32)
Calcium: 9.2 mg/dL (ref 8.4–10.5)
Chloride: 106 mEq/L (ref 96–112)
Creat: 1.1 mg/dL (ref 0.50–1.35)
Glucose, Bld: 107 mg/dL — ABNORMAL HIGH (ref 70–99)

## 2013-04-01 LAB — HEMOGLOBIN A1C: Mean Plasma Glucose: 120 mg/dL — ABNORMAL HIGH (ref <117)

## 2013-04-01 MED ORDER — OLMESARTAN MEDOXOMIL 20 MG PO TABS: 20.0000 mg | ORAL_TABLET | Freq: Every day | ORAL | Status: DC

## 2013-04-01 NOTE — Progress Notes (Signed)
  Subjective:    Patient ID: Glenn Meyer, male    DOB: 06/30/1951, 62 y.o.   MRN: [id number]  HPI HTN-  Pt denies chest pain, SOB, dizziness, or heart palpitations.  Taking meds as directed w/o problems.  Denies medication side effects.   Anxiety - Says well controlled on an OTC herbal suplement. Sleeping well.    Abnormal glucose - he's had just borderline elevated glucose the last couple years on his fasting lab work. No increased thirst or urination.   Review of Systems     Objective:   Physical Exam  Constitutional: He is oriented to person, place, and time. He appears well-developed and well-nourished.  HENT:  Head: Normocephalic and atraumatic.  Cardiovascular: Normal rate, regular rhythm and normal heart sounds.   No carotid bruits  Pulmonary/Chest: Effort normal and breath sounds normal.  Neurological: He is alert and oriented to person, place, and time.  Skin: Skin is warm and dry.  Psychiatric: He has a normal mood and affect. His behavior is normal.          Assessment & Plan:  HTN - Well controlled. Continue current regimen. Refill sent to pharmacy. Followup in 6 months. Due for BMP today.  Anxiety - Well controlled.   Abnormal glucose - Will check A1C today. We'll call the results once available. Continue to monitor.

## 2013-11-11 ENCOUNTER — Ambulatory Visit (INDEPENDENT_AMBULATORY_CARE_PROVIDER_SITE_OTHER): Payer: 59 | Admitting: Family Medicine

## 2013-11-11 ENCOUNTER — Encounter: Payer: Self-pay | Admitting: Family Medicine

## 2013-11-11 VITALS — BP 109/67 | HR 57 | Temp 98.2°F | Ht 76.0 in | Wt 231.0 lb

## 2013-11-11 DIAGNOSIS — I10 Essential (primary) hypertension: Secondary | ICD-10-CM

## 2013-11-11 DIAGNOSIS — R7301 Impaired fasting glucose: Secondary | ICD-10-CM

## 2013-11-11 LAB — POCT GLYCOSYLATED HEMOGLOBIN (HGB A1C): HEMOGLOBIN A1C: 5.8

## 2013-11-11 MED ORDER — OLMESARTAN MEDOXOMIL 20 MG PO TABS: 20.0000 mg | ORAL_TABLET | Freq: Every day | ORAL | Status: DC

## 2013-11-11 NOTE — Progress Notes (Signed)
   Subjective:    Patient ID: Glenn Meyer, male    DOB: [birth year], 63 y.o.   MRN: [id number]  HPI Hypertension- Pt denies chest pain, SOB, dizziness, or heart palpitations.  Taking meds as directed w/o problems.  Denies medication side effects.    IFG - No inc thrist or urination.  Lab Results  Component Value Date   HGBA1C 5.8* 04/01/2013   His wife's mother is suffering from advanced dementia.  They are traveling to [place].   Review of Systems     Objective:   Physical Exam  Constitutional: He is oriented to person, place, and time. He appears well-developed and well-nourished.  HENT:  Head: Normocephalic and atraumatic.  Cardiovascular: Normal rate, regular rhythm and normal heart sounds.   Pulmonary/Chest: Effort normal and breath sounds normal.  Neurological: He is alert and oriented to person, place, and time.  Skin: Skin is warm and dry.  Psychiatric: He has a normal mood and affect. His behavior is normal.          Assessment & Plan:  HTN - Well controlled.  F/u in 6 months. RF sent to phamracy.    IFG - Stable. REcheck in 6 months. Continue to work on diet and exercise.

## 2014-01-13 ENCOUNTER — Emergency Department (INDEPENDENT_AMBULATORY_CARE_PROVIDER_SITE_OTHER)
Admission: EM | Admit: 2014-01-13 | Discharge: 2014-01-13 | Disposition: A | Discharge status: Home / Self Care | Payer: 59 | Source: Home / Self Care | Attending: Emergency Medicine | Admitting: Emergency Medicine

## 2014-01-13 ENCOUNTER — Encounter: Payer: Self-pay | Admitting: Emergency Medicine

## 2014-01-13 DIAGNOSIS — L723 Sebaceous cyst: Secondary | ICD-10-CM

## 2014-01-13 DIAGNOSIS — L729 Follicular cyst of the skin and subcutaneous tissue, unspecified: Secondary | ICD-10-CM

## 2014-01-13 MED ORDER — DOXYCYCLINE HYCLATE 100 MG PO CAPS: 100.0000 mg | ORAL_CAPSULE | Freq: Two times a day (BID) | ORAL | Status: DC

## 2014-01-13 NOTE — ED Notes (Signed)
Glenn Meyer c/o cyst on back x 5 days. Denies fever or chills, no hx MRSA.

## 2014-01-13 NOTE — ED Provider Notes (Signed)
CSN: [id number]     Arrival date & time 01/13/14  0347 History   First MD Initiated Contact with Patient 01/13/14 1007     Chief Complaint  Patient presents with  . Cyst   (Consider location/radiation/quality/duration/timing/severity/associated sxs/prior Treatment) HPI This patient complains of a RASH / cyst on his back.  He has had one on his back before that got much worse and he had to see a dermatologist for so he wanted to come in early.  Location: back Onset: 4 days ago   Course: worsening Self-treated with: N/A             Improvement with treatment: N/A  History Itching: no  Tenderness: yes  New medications/antibiotics: no  Pet exposure: no  Recent travel or tropical exposure: no  New soaps, shampoos, detergent, clothing: no  Tick/insect exposure: no   Red Flags Feeling ill: no  Fever: no  Facial/tongue swelling/difficulty breathing:  no  Diabetic or immunocompromised: no, but does have a history of mild impaired fasting glucose     Past Medical History  Diagnosis Date  . TMJ (dislocation of temporomandibular joint)   . Hypertension   . Tinnitus   . Ringing of ears     chronic X 20 years  . Hyperlipidemia    Past Surgical History  Procedure Laterality Date  . Tonsilectomy, adenoidectomy, bilateral myringotomy and tubes     Family History  Problem Relation Age of Onset  . Hypertension    . Hyperlipidemia    . Hypertension Mother   . Hyperlipidemia Mother   . Kidney disease Mother     congenital/diaylisis 35's  . Heart disease Father     bypass 2000   History  Substance Use Topics  . Smoking status: Former Smoker -- 3 years    Types: Cigarettes    Quit date: 10/22/1972  . Smokeless tobacco: Never Used     Comment: quit 34 years ago  . Alcohol Use: 6.0 oz/week    12 drink(s) per week     Comment: per week    Review of Systems  All other systems reviewed and are negative.    Allergies  Lisinopril-hydrochlorothiazide and Meperidine  hcl  Home Medications   Current Outpatient Rx  Name  Route  Sig  Dispense  Refill  . Multiple Vitamin (MULTIVITAMIN) capsule   Oral   Take 1 capsule by mouth daily.           Marland Kitchen olmesartan (BENICAR) 20 MG tablet   Oral   Take 1 tablet (20 mg total) by mouth daily.   30 tablet   6   . Omega-3 Fatty Acids (FISH OIL) 1000 MG CAPS   Oral   Take by mouth.           Marland Kitchen arginine 500 MG tablet   Oral   Take 500 mg by mouth daily.         Marland Kitchen doxycycline (VIBRAMYCIN) 100 MG capsule   Oral   Take 1 capsule (100 mg total) by mouth 2 (two) times daily.   14 capsule   0   . glucosamine-chondroitin 500-400 MG tablet   Oral   Take 1 tablet by mouth daily.         . Melatonin 5 MG TABS   Oral   Take 5 mg by mouth as needed.         . meloxicam (MOBIC) 15 MG tablet      One tab PO qAM with breakfast  for 2 weeks, then daily prn pain.   30 tablet   3    BP 131/81  Pulse 68  Temp(Src) 98.7 F (37.1 C) (Oral)  Resp 14  Wt 230 lb (104.327 kg)  SpO2 98% Physical Exam  Nursing note and vitals reviewed. Constitutional: He is oriented to person, place, and time. He appears well-developed and well-nourished.  HENT:  Head: Normocephalic and atraumatic.  Eyes: No scleral icterus.  Neck: Neck supple.  Cardiovascular: Regular rhythm and normal heart sounds.   Pulmonary/Chest: Effort normal and breath sounds normal. No respiratory distress.  Neurological: He is alert and oriented to person, place, and time.  Skin: Skin is warm and dry.     There is a 1-1/2 cm in diameter cyst on the midline of his mid back.  It is mildly tender to palpation.  There is no surrounding erythema.  No active drainage or bleeding.  No other lesions are seen.  Psychiatric: He has a normal mood and affect. His speech is normal.    ED Course  Procedures (including critical care time) Labs Review Labs Reviewed  WOUND CULTURE   Imaging Review No results found.   MDM   1. Cyst of skin   2.  Sebaceous cyst    Likely a sebaceous cyst.  After verbal consent, I&D was performed.  Area cleansed with iodine and then using 1 cc of 1% lidocaine with epinephrine, I anesthetized the cyst.  I then used a scalpel to incise a small cut into the cyst.  Purulent and sebaceous material was evacuated from the cyst.  Using a curved sterile hemostat, I explored to break any loculations.  A wound culture was taken during the procedure.  Then put a absorptive dressing on it.  Given a prescription for doxycycline him we'll call him back in a few days with the culture results and see how he is doing.  Janeann Forehand, MD 01/13/14 1030

## 2014-01-14 LAB — LIPID PANEL
Cholesterol: 156 mg/dL (ref 0–200)
HDL: 38 mg/dL — AB (ref >39)
LDL CALC: 98 mg/dL (ref 0–99)
TRIGLYCERIDES: 100 mg/dL (ref <150)
Total CHOL/HDL Ratio: 4.1 Ratio
VLDL: 20 mg/dL (ref 0–40)

## 2014-01-14 LAB — COMPLETE METABOLIC PANEL WITH GFR
ALT: 29 U/L (ref 0–53)
AST: 25 U/L (ref 0–37)
Albumin: 4.2 g/dL (ref 3.5–5.2)
Alkaline Phosphatase: 68 U/L (ref 39–117)
BUN: 13 mg/dL (ref 6–23)
CALCIUM: 9.2 mg/dL (ref 8.4–10.5)
CO2: 26 mEq/L (ref 19–32)
CREATININE: 0.99 mg/dL (ref 0.50–1.35)
Chloride: 101 mEq/L (ref 96–112)
GFR, Est African American: >89 mL/min
GFR, Est Non African American: 82 mL/min
Glucose, Bld: 100 mg/dL — ABNORMAL HIGH (ref 70–99)
Potassium: 4.7 mEq/L (ref 3.5–5.3)
Sodium: 135 mEq/L (ref 135–145)
TOTAL PROTEIN: 6.7 g/dL (ref 6.0–8.3)
Total Bilirubin: 0.4 mg/dL (ref 0.2–1.2)

## 2014-01-16 ENCOUNTER — Telehealth: Payer: Self-pay

## 2014-01-16 LAB — WOUND CULTURE
Gram Stain: NONE SEEN
Organism ID, Bacteria: NO GROWTH

## 2014-01-16 NOTE — ED Notes (Signed)
I called and spoke with patient and he is doing better. I advised to call back if anything changes or if he has questions or concerns.  

## 2014-05-14 ENCOUNTER — Telehealth: Payer: Self-pay | Admitting: *Deleted

## 2014-05-14 NOTE — Telephone Encounter (Signed)
Fax rec'd from [facility] today that there is no auth required for Benicar. Margette Fast, CMA

## 2014-07-02 ENCOUNTER — Other Ambulatory Visit: Payer: Self-pay | Admitting: Family Medicine

## 2014-08-02 ENCOUNTER — Other Ambulatory Visit: Payer: Self-pay | Admitting: Family Medicine

## 2014-08-03 ENCOUNTER — Other Ambulatory Visit: Payer: Self-pay | Admitting: *Deleted

## 2014-08-03 MED ORDER — OLMESARTAN MEDOXOMIL 20 MG PO TABS: 20.0000 mg | ORAL_TABLET | Freq: Every day | ORAL | Status: DC

## 2014-08-03 NOTE — Progress Notes (Signed)
Benicar rx sent to [facility]. Margette Fast, CMA

## 2014-08-06 ENCOUNTER — Encounter: Payer: Self-pay | Admitting: Family Medicine

## 2014-08-06 ENCOUNTER — Ambulatory Visit (INDEPENDENT_AMBULATORY_CARE_PROVIDER_SITE_OTHER): Payer: 59 | Admitting: Family Medicine

## 2014-08-06 VITALS — BP 121/77 | HR 65 | Temp 98.3°F | Ht 76.0 in | Wt 223.0 lb

## 2014-08-06 DIAGNOSIS — I10 Essential (primary) hypertension: Secondary | ICD-10-CM

## 2014-08-06 DIAGNOSIS — Z23 Encounter for immunization: Secondary | ICD-10-CM

## 2014-08-06 DIAGNOSIS — R7309 Other abnormal glucose: Secondary | ICD-10-CM

## 2014-08-06 LAB — POCT GLYCOSYLATED HEMOGLOBIN (HGB A1C): Hemoglobin A1C: 5.6

## 2014-08-06 MED ORDER — OLMESARTAN MEDOXOMIL 20 MG PO TABS: 20.0000 mg | ORAL_TABLET | Freq: Every day | ORAL | Status: DC

## 2014-08-06 NOTE — Progress Notes (Signed)
   Subjective:    Patient ID: Glenn Meyer, male    DOB: 1951-07-21, 63 y.o.   MRN: [id number]  Hypertension   Hypertension- Pt denies chest pain, SOB, dizziness, or heart palpitations.  Taking meds as directed w/o problems.  Denies medication side effects.    Review of Systems     Objective:   Physical Exam  Constitutional: He is oriented to person, place, and time. He appears well-developed and well-nourished.  HENT:  Head: Normocephalic and atraumatic.  Cardiovascular: Normal rate, regular rhythm and normal heart sounds.   Pulmonary/Chest: Effort normal and breath sounds normal.  Neurological: He is alert and oriented to person, place, and time.  Skin: Skin is warm and dry.  Psychiatric: He has a normal mood and affect. His behavior is normal.          Assessment & Plan:  HTN - well controlled.  F/U in 6 months. RF sent to pharmacy.  Coupon card given.

## 2014-09-05 ENCOUNTER — Other Ambulatory Visit: Payer: Self-pay | Admitting: Family Medicine

## 2014-09-06 ENCOUNTER — Other Ambulatory Visit: Payer: Self-pay

## 2014-09-06 ENCOUNTER — Other Ambulatory Visit: Payer: Self-pay | Admitting: *Deleted

## 2014-09-06 MED ORDER — OLMESARTAN MEDOXOMIL 20 MG PO TABS: 20.0000 mg | ORAL_TABLET | Freq: Every day | ORAL | Status: DC

## 2014-09-06 MED ORDER — OLMESARTAN MEDOXOMIL 20 MG PO TABS: ORAL_TABLET | ORAL | Status: DC

## 2014-09-14 ENCOUNTER — Encounter: Payer: Self-pay | Admitting: *Deleted

## 2014-09-14 ENCOUNTER — Emergency Department (INDEPENDENT_AMBULATORY_CARE_PROVIDER_SITE_OTHER)
Admission: EM | Admit: 2014-09-14 | Discharge: 2014-09-14 | Disposition: A | Discharge status: Home / Self Care | Payer: 59 | Source: Home / Self Care

## 2014-09-14 DIAGNOSIS — J209 Acute bronchitis, unspecified: Secondary | ICD-10-CM

## 2014-09-14 MED ORDER — HYDROCODONE-HOMATROPINE 5-1.5 MG/5ML PO SYRP: 5.0000 mL | ORAL_SOLUTION | Freq: Every day | ORAL | Status: DC

## 2014-09-14 MED ORDER — AZITHROMYCIN 250 MG PO TABS: ORAL_TABLET | ORAL | Status: DC

## 2014-09-14 NOTE — Discharge Instructions (Signed)

## 2014-09-14 NOTE — ED Notes (Signed)
Pt c/o productive cough and hoarseness x 3 days. Denies fever.

## 2014-09-14 NOTE — ED Provider Notes (Signed)
CSN: [id number]     Arrival date & time 09/14/14  1136 History   None    Chief Complaint  Patient presents with  . Cough  . Hoarse   (Consider location/radiation/quality/duration/timing/severity/associated sxs/prior Treatment) HPI  Pt presents to the clinic with productive cough for 4 days with greenish/white sputum. Denies any fever, chills, ear pain. Has had some sinus pressure. Tried mucinex no relief. Cough worse at night. No wheezing or SOB. Hx of pneumonia. He felt like he had been fighting off cold for last week though.   Past Medical History  Diagnosis Date  . TMJ (dislocation of temporomandibular joint)   . Hypertension   . Tinnitus   . Ringing of ears     chronic X 20 years  . Hyperlipidemia    Past Surgical History  Procedure Laterality Date  . Tonsilectomy, adenoidectomy, bilateral myringotomy and tubes     Family History  Problem Relation Age of Onset  . Hypertension    . Hyperlipidemia    . Hypertension Mother   . Hyperlipidemia Mother   . Kidney disease Mother     congenital/diaylisis 45's  . Heart disease Father     bypass 2000   History  Substance Use Topics  . Smoking status: Former Smoker -- 3 years    Types: Cigarettes    Quit date: 10/22/1972  . Smokeless tobacco: Never Used     Comment: quit 34 years ago  . Alcohol Use: 6.0 oz/week    12 drink(s) per week     Comment: per week    Review of Systems  All other systems reviewed and are negative.   Allergies  Lisinopril-hydrochlorothiazide and Meperidine hcl  Home Medications   Prior to Admission medications   Medication Sig Start Date End Date Taking? Authorizing Provider  arginine 500 MG tablet Take 500 mg by mouth daily.    Historical Provider, MD  azithromycin (ZITHROMAX Z-PAK) 250 MG tablet Take 2 tablets (500 mg) on  Day 1,  followed by 1 tablet (250 mg) once daily on Days 2 through 5. 09/14/14   Jade L Breeback, PA-C  Ginkgo Biloba 40 MG TABS Take by mouth.    Historical Provider,  MD  HYDROcodone-homatropine (HYCODAN) 5-1.5 MG/5ML syrup Take 5 mLs by mouth at bedtime. 09/14/14   Donella Stade, PA-C  Multiple Vitamin (MULTIVITAMIN) capsule Take 1 capsule by mouth daily.      Historical Provider, MD  olmesartan (BENICAR) 20 MG tablet take 1 tablet by mouth once daily 09/06/14   Hali Marry, MD  Omega-3 Fatty Acids (FISH OIL) 1000 MG CAPS Take by mouth.      Historical Provider, MD   BP 113/72 mmHg  Pulse 66  Temp(Src) 98 F (36.7 C) (Oral)  Resp 18  Ht 6\' 4"  (1.93 m)  Wt 222 lb (100.699 kg)  BMI 27.03 kg/m2  SpO2 98% Physical Exam  Constitutional: He is oriented to person, place, and time. He appears well-developed and well-nourished.  HENT:  Head: Normocephalic and atraumatic.  Right Ear: External ear normal.  Left Ear: External ear normal.  Nose: Nose normal.  Mouth/Throat: Oropharynx is clear and moist. No oropharyngeal exudate.  Eyes: Conjunctivae are normal. Right eye exhibits no discharge. Left eye exhibits no discharge.  Neck: Normal range of motion. Neck supple.  Cardiovascular: Normal rate, regular rhythm and normal heart sounds.   Pulmonary/Chest: Effort normal and breath sounds normal.  No wheezing.  Few scattered rhonchi bilateral lungs.  Clearing of rhonchi  with cough.   Lymphadenopathy:    He has no cervical adenopathy.  Neurological: He is alert and oriented to person, place, and time.  Skin: Skin is dry.  Psychiatric: He has a normal mood and affect. His behavior is normal.    ED Course  Procedures (including critical care time) Labs Review Labs Reviewed - No data to display  Imaging Review No results found.   MDM   1. Acute bronchitis, unspecified organism    Treated with zpak, Hycodan given for cough at bedtime.  Discussed to continue mucinex for next couple of days. HO given.  Follow up if symptoms worsen.     Donella Stade, PA-C 09/14/14 1305

## 2014-10-18 ENCOUNTER — Telehealth: Payer: Self-pay

## 2014-10-18 MED ORDER — HYDROCORTISONE ACETATE 30 MG RE SUPP: 1.0000 | Freq: Two times a day (BID) | RECTAL | Status: DC | PRN

## 2014-10-18 NOTE — Telephone Encounter (Signed)
I will send over a rx. Let me know if helping or not

## 2014-10-18 NOTE — Telephone Encounter (Signed)
Glenn Meyer complains of hemorrhoids. He has tried OTC preparation H and witch hazel with little relief. November was the first time he has ever had hemorrhoids and they flared back up a few days ago. He wanted to know if he could get some medication sent in or will he need an office visit. Please advise.

## 2014-10-20 NOTE — Telephone Encounter (Signed)
Patient advised.

## 2015-02-09 ENCOUNTER — Ambulatory Visit: Payer: 59 | Admitting: Family Medicine

## 2015-06-17 ENCOUNTER — Other Ambulatory Visit: Payer: Self-pay | Admitting: Family Medicine

## 2015-07-29 ENCOUNTER — Ambulatory Visit (INDEPENDENT_AMBULATORY_CARE_PROVIDER_SITE_OTHER): Payer: BLUE CROSS/BLUE SHIELD | Admitting: Family Medicine

## 2015-07-29 ENCOUNTER — Encounter: Payer: Self-pay | Admitting: Family Medicine

## 2015-07-29 VITALS — BP 124/69 | HR 60 | Ht 76.0 in | Wt 232.0 lb

## 2015-07-29 DIAGNOSIS — I10 Essential (primary) hypertension: Secondary | ICD-10-CM | POA: Diagnosis not present

## 2015-07-29 DIAGNOSIS — Z23 Encounter for immunization: Secondary | ICD-10-CM

## 2015-07-29 DIAGNOSIS — R7301 Impaired fasting glucose: Secondary | ICD-10-CM | POA: Diagnosis not present

## 2015-07-29 DIAGNOSIS — E785 Hyperlipidemia, unspecified: Secondary | ICD-10-CM

## 2015-07-29 LAB — POCT GLYCOSYLATED HEMOGLOBIN (HGB A1C): HEMOGLOBIN A1C: 5.9

## 2015-07-29 NOTE — Progress Notes (Signed)
   Subjective:    Patient ID: Glenn Meyer, male    DOB: Feb 20, 1951, 64 y.o.   MRN: [id number]  HPI Hypertension- Pt denies chest pain, SOB, dizziness, or heart palpitations.  Taking meds as directed w/o problems.  Denies medication side effects.    IFG - has gained about 8 lbs. Says has been stress eating. Still very physically active.    Has had family living with him for the last 1.5 years.  This is been quite stressful for him mentally. His been stress eating more and has gained some weight.  Hyperlipidemia-not currently on medications.  Review of Systems     Objective:   Physical Exam  Constitutional: He is oriented to person, place, and time. He appears well-developed and well-nourished.  HENT:  Head: Normocephalic and atraumatic.  Cardiovascular: Normal rate, regular rhythm and normal heart sounds.   Pulmonary/Chest: Effort normal and breath sounds normal.  Neurological: He is alert and oriented to person, place, and time.  Skin: Skin is warm and dry.  Psychiatric: He has a normal mood and affect. His behavior is normal.          Assessment & Plan:  HTN - well controlled.  Continue current regimen. Due for CMP and lipids. Follow-up in 6 months.  Hyperlipidemia-due to recheck lipid panel.  IFG - A1c was 5.9 today. This is up a little bit from previous A1c of 5.6. Get back on track with diet and exercise and repeat again in 6 months.  Flu vaccine given today.

## 2015-08-04 LAB — LIPID PANEL
Cholesterol: 202 mg/dL — ABNORMAL HIGH (ref 125–200)
HDL: 40 mg/dL (ref >=40)
LDL CALC: 142 mg/dL — AB (ref <130)
TRIGLYCERIDES: 98 mg/dL (ref <150)
Total CHOL/HDL Ratio: 5.1 Ratio — ABNORMAL HIGH (ref <=5.0)
VLDL: 20 mg/dL (ref <30)

## 2015-08-04 LAB — COMPLETE METABOLIC PANEL WITH GFR
ALT: 33 U/L (ref 9–46)
AST: 33 U/L (ref 10–35)
Albumin: 4.1 g/dL (ref 3.6–5.1)
Alkaline Phosphatase: 56 U/L (ref 40–115)
BILIRUBIN TOTAL: 0.5 mg/dL (ref 0.2–1.2)
BUN: 16 mg/dL (ref 7–25)
CHLORIDE: 103 mmol/L (ref 98–110)
CO2: 27 mmol/L (ref 20–31)
Calcium: 9.1 mg/dL (ref 8.6–10.3)
Creat: 0.95 mg/dL (ref 0.70–1.25)
GFR, EST NON AFRICAN AMERICAN: 85 mL/min (ref >=60)
GFR, Est African American: >89 mL/min (ref >=60)
GLUCOSE: 102 mg/dL — AB (ref 65–99)
Potassium: 5 mmol/L (ref 3.5–5.3)
SODIUM: 137 mmol/L (ref 135–146)
TOTAL PROTEIN: 6.7 g/dL (ref 6.1–8.1)

## 2015-08-21 ENCOUNTER — Other Ambulatory Visit: Payer: Self-pay | Admitting: Family Medicine

## 2015-09-25 ENCOUNTER — Emergency Department
Admission: EM | Admit: 2015-09-25 | Discharge: 2015-09-25 | Disposition: A | Discharge status: Home / Self Care | Payer: BLUE CROSS/BLUE SHIELD | Source: Home / Self Care | Attending: Emergency Medicine | Admitting: Emergency Medicine

## 2015-09-25 DIAGNOSIS — J0110 Acute frontal sinusitis, unspecified: Secondary | ICD-10-CM

## 2015-09-25 MED ORDER — CEFACLOR 500 MG PO CAPS: 500.0000 mg | ORAL_CAPSULE | Freq: Two times a day (BID) | ORAL | Status: DC

## 2015-09-25 NOTE — ED Provider Notes (Signed)
CSN: [id number]     Arrival date & time 09/25/15  1414 History   First MD Initiated Contact with Patient 09/25/15 1423     Chief Complaint  Patient presents with  . Facial Pain    HPI SINUSITIS  Onset: 4 days Facial/sinus pressure, especially frontal sinus bilaterally, with discolored nasal mucus.    Severity: moderate-severe Tried OTC meds without significant relief. He feels this is a sinus infection which typically starts with these symptoms. He states Ceclor has worked the best in the past for sinus infections.  Symptoms:  + Low-grade Fever  + URI prodrome with nasal congestion No Minimal swollen neck glands + mild Sinus Headache + mild ear pressure bilaterally  No Allergy symptoms No significant Sore Throat No eye symptoms     No significant Cough No chest pain No shortness of breath  No wheezing  No Abdominal Pain No Nausea No Vomiting No diarrhea  No Myalgias No focal neurologic symptoms No syncope No Rash  No Urinary symptoms  Remainder of Review of Systems negative except as noted in the HPI.  Past Medical History  Diagnosis Date  . TMJ (dislocation of temporomandibular joint)   . Hypertension   . Tinnitus   . Ringing of ears     chronic X 20 years  . Hyperlipidemia    Past Surgical History  Procedure Laterality Date  . Tonsilectomy, adenoidectomy, bilateral myringotomy and tubes    . Tonsillectomy      age 4   Family History  Problem Relation Age of Onset  . Hypertension    . Hyperlipidemia    . Hypertension Mother   . Hyperlipidemia Mother   . Kidney disease Mother     congenital/diaylisis 81's  . Heart disease Father     bypass 2000   Social History  Substance Use Topics  . Smoking status: Former Smoker -- 3 years    Types: Cigarettes    Quit date: 10/22/1972  . Smokeless tobacco: Never Used     Comment: quit 34 years ago  . Alcohol Use: 4.2 oz/week    7 Standard drinks or equivalent per week     Comment: per week     Review of Systems  Allergies  Lisinopril-hydrochlorothiazide and Meperidine hcl  Home Medications   Prior to Admission medications   Medication Sig Start Date End Date Taking? Authorizing Provider  arginine 500 MG tablet Take 500 mg by mouth daily.    Historical Provider, MD  BENICAR 20 MG tablet take 1 tablet by mouth once daily 08/22/15   Hali Marry, MD  cefaclor (CECLOR) 500 MG capsule Take 1 capsule (500 mg total) by mouth 2 (two) times daily. For 10 days. Take with food. 09/25/15   Jacqulyn Cane, MD  Multiple Vitamin (MULTIVITAMIN) capsule Take 1 capsule by mouth daily.      Historical Provider, MD  Omega-3 Fatty Acids (FISH OIL) 1000 MG CAPS Take by mouth.      Historical Provider, MD  Saw Palmetto, Serenoa repens, (SAW PALMETTO PO) Take by mouth daily.    Historical Provider, MD   Meds Ordered and Administered this Visit  Medications - No data to display  BP 132/76 mmHg  Pulse 94  Temp(Src) 98.2 F (36.8 C) (Oral)  Ht 6\' 4"  (1.93 m)  Wt 235 lb 8 oz (106.822 kg)  BMI 28.68 kg/m2  SpO2 100% No data found.   Physical Exam  Constitutional: He is oriented to person, place, and time. He appears  well-developed and well-nourished. No distress.  HENT:  Head: Normocephalic and atraumatic.  Right Ear: Tympanic membrane, external ear and ear canal normal.  Left Ear: Tympanic membrane, external ear and ear canal normal.  Nose: Mucosal edema and rhinorrhea present. Right sinus exhibits maxillary sinus tenderness. Left sinus exhibits maxillary sinus tenderness.  Mouth/Throat: Oropharynx is clear and moist. No oral lesions. No oropharyngeal exudate.  Bilateral frontal sinus tenderness present.  Eyes: Right eye exhibits no discharge. Left eye exhibits no discharge. No scleral icterus.  Neck: Neck supple.  Cardiovascular: Normal rate, regular rhythm and normal heart sounds.   Pulmonary/Chest: Effort normal and breath sounds normal. He has no wheezes. He has no rales.   Lymphadenopathy:    He has no cervical adenopathy.  Neurological: He is alert and oriented to person, place, and time.  Skin: Skin is warm and dry.  Nursing note and vitals reviewed.   ED Course  Procedures (including critical care time)  Labs Review Labs Reviewed - No data to display  Imaging Review No results found.    MDM   1. Acute frontal sinusitis, recurrence not specified    Treatment options discussed, as well as risks, benefits, alternatives. Patient voiced understanding and agreement with the following plans: Ceclor 500 mg twice a day 10 days Flonase twice a day Other symptomatic care discussed Follow-up with your primary care doctor in 5-7 days if not improving, or sooner if symptoms become worse. Precautions discussed. Red flags discussed. Questions invited and answered. Patient voiced understanding and agreement.     Jacqulyn Cane, MD 09/25/15 [phone]

## 2015-09-25 NOTE — ED Notes (Signed)
Started Friday with a scratchy throat, headache above eyes, the rest of the symptoms started yesterday.  Sore throat woke pt up several times during the night last night.

## 2016-01-25 ENCOUNTER — Ambulatory Visit (INDEPENDENT_AMBULATORY_CARE_PROVIDER_SITE_OTHER): Payer: BLUE CROSS/BLUE SHIELD | Admitting: Family Medicine

## 2016-01-25 DIAGNOSIS — I10 Essential (primary) hypertension: Secondary | ICD-10-CM

## 2016-01-25 NOTE — Progress Notes (Signed)
   Subjective:    Patient ID: Glenn Meyer, male    DOB: [birth year], 65 y.o.   MRN: [id number]  HPI    Review of Systems     Objective:   Physical Exam        Assessment & Plan:  Patient has been deemed a "no-show" for today's scheduled appointment.  Based on chief complaint and my chart review:  -Follow-up advised.  Contacting patient and urged to schedule visit in 1-2 weeks.  If necessary this was communicated to the patient via phone or mail on: 1:46 PM 01/25/2016

## 2016-02-15 ENCOUNTER — Encounter: Payer: Self-pay | Admitting: Family Medicine

## 2016-02-15 ENCOUNTER — Ambulatory Visit (INDEPENDENT_AMBULATORY_CARE_PROVIDER_SITE_OTHER): Payer: BLUE CROSS/BLUE SHIELD | Admitting: Family Medicine

## 2016-02-15 VITALS — BP 120/64 | HR 55 | Wt 234.0 lb

## 2016-02-15 DIAGNOSIS — Z23 Encounter for immunization: Secondary | ICD-10-CM | POA: Diagnosis not present

## 2016-02-15 DIAGNOSIS — Z114 Encounter for screening for human immunodeficiency virus [HIV]: Secondary | ICD-10-CM | POA: Diagnosis not present

## 2016-02-15 DIAGNOSIS — L821 Other seborrheic keratosis: Secondary | ICD-10-CM | POA: Diagnosis not present

## 2016-02-15 DIAGNOSIS — B079 Viral wart, unspecified: Secondary | ICD-10-CM | POA: Diagnosis not present

## 2016-02-15 DIAGNOSIS — I10 Essential (primary) hypertension: Secondary | ICD-10-CM | POA: Diagnosis not present

## 2016-02-15 DIAGNOSIS — R7301 Impaired fasting glucose: Secondary | ICD-10-CM | POA: Diagnosis not present

## 2016-02-15 DIAGNOSIS — Z1159 Encounter for screening for other viral diseases: Secondary | ICD-10-CM | POA: Diagnosis not present

## 2016-02-15 LAB — BASIC METABOLIC PANEL
BUN: 16 mg/dL (ref 7–25)
CALCIUM: 9.4 mg/dL (ref 8.6–10.3)
CO2: 22 mmol/L (ref 20–31)
Chloride: 105 mmol/L (ref 98–110)
Creat: 0.95 mg/dL (ref 0.70–1.25)
GLUCOSE: 107 mg/dL — AB (ref 65–99)
Potassium: 4.3 mmol/L (ref 3.5–5.3)
SODIUM: 139 mmol/L (ref 135–146)

## 2016-02-15 LAB — POCT GLYCOSYLATED HEMOGLOBIN (HGB A1C): Hemoglobin A1C: 5.7

## 2016-02-15 MED ORDER — OLMESARTAN MEDOXOMIL 20 MG PO TABS: 20.0000 mg | ORAL_TABLET | Freq: Every day | ORAL | Status: DC

## 2016-02-15 NOTE — Progress Notes (Signed)
   Subjective:    Patient ID: Glenn Meyer, male    DOB: [birth year], 65 y.o.   MRN: [id number]  HPI Hypertension- Pt denies chest pain, SOB, dizziness, or heart palpitations.  Taking meds as directed w/o problems.  Denies medication side effects.    Wart - wart on L index finger would like to have this frozen off. He says that wart has come back on and off in the exact same location for almost 20 years. He did have it frozen off a couple of years ago and would like to have that done again.  He also has a seborrheic keratosis at the base of the left side of the neck that he would also like to have frozen.    Review of Systems     Objective:   Physical Exam  Constitutional: He is oriented to person, place, and time. He appears well-developed and well-nourished.  HENT:  Head: Normocephalic and atraumatic.  Cardiovascular: Normal rate, regular rhythm and normal heart sounds.   Pulmonary/Chest: Effort normal and breath sounds normal.  Neurological: He is alert and oriented to person, place, and time.  Skin: Skin is warm and dry.  Wart on left index finger.   Psychiatric: He has a normal mood and affect. His behavior is normal.   Small darkly pigmented approximately 4 mm round seborrheic keratosis of the base of the left side of the neck.       Assessment & Plan:  HTN - Well controlled. Continue current regimen. Follow up in 6 mo. Due labs.   Wart - Recommend treatment with cryotherapy. Patient says he is having them before. Patient tolerated well.  Seborrheic keratosis-cryotherapy performed. Patient tolerated well.  Tdap given today.   Cryotherapy Procedure Note  Pre-operative Diagnosis: wart on left index finger. Seborrheic keratosis at the base of the left side of the neck.  Post-operative Diagnosis: same  Locations: left index finger and left lower neck.    Indications: irritation  Anesthesia: not required    Procedure Details   Patient informed of risks  (permanent scarring, infection, light or dark discoloration, bleeding, infection, weakness, numbness and recurrence of the lesion) and benefits of the procedure and verbal informed consent obtained.  The areas are treated with liquid nitrogen therapy, frozen until ice ball extended 2 mm beyond lesion, allowed to thaw, and treated again. The patient tolerated procedure well.  The patient was instructed on post-op care, warned that there may be blister formation, redness and pain. Recommend OTC analgesia as needed for pain.  Condition: Stable  Complications: none.  Plan: 1. Instructed to keep the area dry and covered for 24-48h and clean thereafter. 2. Warning signs of infection were reviewed.   3. Recommended that the patient use OTC acetaminophen as needed for pain.  4. Return PRN.

## 2016-02-16 LAB — HIV ANTIBODY (ROUTINE TESTING W REFLEX): HIV 1&2 Ab, 4th Generation: NONREACTIVE

## 2016-02-16 LAB — HEPATITIS C ANTIBODY: HCV AB: NEGATIVE

## 2016-07-02 ENCOUNTER — Other Ambulatory Visit: Payer: Self-pay

## 2016-07-02 ENCOUNTER — Telehealth: Payer: Self-pay

## 2016-07-02 ENCOUNTER — Other Ambulatory Visit: Payer: Self-pay | Admitting: Family Medicine

## 2016-07-02 DIAGNOSIS — Z125 Encounter for screening for malignant neoplasm of prostate: Secondary | ICD-10-CM

## 2016-07-02 NOTE — Telephone Encounter (Signed)
PSA lab ordered

## 2016-07-04 LAB — PSA: PSA: 1.4 ng/mL (ref <=4.0)

## 2016-07-05 NOTE — Progress Notes (Signed)
All labs are normal. 

## 2016-08-15 ENCOUNTER — Ambulatory Visit: Payer: BLUE CROSS/BLUE SHIELD | Admitting: Family Medicine

## 2016-09-05 ENCOUNTER — Ambulatory Visit: Payer: BLUE CROSS/BLUE SHIELD | Admitting: Family Medicine

## 2016-09-06 ENCOUNTER — Ambulatory Visit (INDEPENDENT_AMBULATORY_CARE_PROVIDER_SITE_OTHER): Payer: BLUE CROSS/BLUE SHIELD | Admitting: Family Medicine

## 2016-09-06 ENCOUNTER — Encounter: Payer: Self-pay | Admitting: Family Medicine

## 2016-09-06 VITALS — BP 127/70 | HR 66 | Wt 238.0 lb

## 2016-09-06 DIAGNOSIS — I10 Essential (primary) hypertension: Secondary | ICD-10-CM | POA: Diagnosis not present

## 2016-09-06 DIAGNOSIS — J069 Acute upper respiratory infection, unspecified: Secondary | ICD-10-CM

## 2016-09-06 LAB — BASIC METABOLIC PANEL WITH GFR
BUN: 15 mg/dL (ref 7–25)
CALCIUM: 9.7 mg/dL (ref 8.6–10.3)
CO2: 29 mmol/L (ref 20–31)
CREATININE: 1.01 mg/dL (ref 0.70–1.25)
Chloride: 103 mmol/L (ref 98–110)
GFR, Est Non African American: 79 mL/min (ref >=60)
Glucose, Bld: 110 mg/dL — ABNORMAL HIGH (ref 65–99)
Potassium: 4.5 mmol/L (ref 3.5–5.3)
SODIUM: 139 mmol/L (ref 135–146)

## 2016-09-06 NOTE — Progress Notes (Signed)
Subjective:    CC: HTN  HPI: Hypertension- Pt denies chest pain, SOB, dizziness, or heart palpitations.  Taking meds as directed w/o problems.  Denies medication side effects.    URI - He said 5 days of nasal congestion postnasal drip mild sore throat. No fevers chills or sweats. Has been taking vitamin C and zinc.  Past medical history, Surgical history, Family history not pertinant except as noted below, Social history, Allergies, and medications have been entered into the medical record, reviewed, and corrections made.   Review of Systems: No fevers, chills, night sweats, weight loss, chest pain, or shortness of breath.   Objective:    General: Well Developed, well nourished, and in no acute distress.  Neuro: Alert and oriented x3, extra-ocular muscles intact, sensation grossly intact.  HEENT: Normocephalic, atraumatic , Oropharynx is clear, TMs and canals are clear bilaterally. No significant cervical lymphadenopathy that he has 1 small lymph node on the right anterior cervical area that just mildly swollen. Nontender. Skin: Warm and dry, no rashes. Cardiac: Regular rate and rhythm, no murmurs rubs or gallops, no lower extremity edema.  Respiratory: Clear to auscultation bilaterally. Not using accessory muscles, speaking in full sentences.   Impression and Recommendations:   HTN - Well controlled. Continue current regimen. Follow up in  6 months.   URI-likely viral. Call if not significantly better by early next week.

## 2016-09-07 NOTE — Progress Notes (Signed)
All labs are normal. 

## 2016-10-02 ENCOUNTER — Telehealth: Payer: Self-pay | Admitting: Family Medicine

## 2016-10-02 NOTE — Telephone Encounter (Signed)
Call patient: We got notification from his pharmacy services that there are no longer going to cover Benicar as of January. They will cover candesartan, irbesartan, losartan, omelsartan, valsartan, telmisartan. The omelsartan is the generic for Benicar so that is the closest to what he is Glenn Meyer taking. Please just let him know that we can change this at any time.

## 2016-10-02 NOTE — Telephone Encounter (Signed)
Pt informed is ok with the change.Glenn Meyer, [name]

## 2016-10-03 MED ORDER — OLMESARTAN MEDOXOMIL 20 MG PO TABS: 20.0000 mg | ORAL_TABLET | Freq: Every day | ORAL | 1 refills | Status: DC

## 2016-10-03 NOTE — Telephone Encounter (Signed)
New rx sent for generic benicar.

## 2016-12-20 ENCOUNTER — Telehealth: Payer: Self-pay

## 2016-12-20 NOTE — Telephone Encounter (Signed)
Glenn Meyer believes his ED problems may come from taking Benicar. He would like to switch to a different medication. Please advise.

## 2016-12-21 MED ORDER — AMLODIPINE BESYLATE 5 MG PO TABS: 5.0000 mg | ORAL_TABLET | Freq: Every day | ORAL | 3 refills | Status: DC

## 2016-12-21 NOTE — Telephone Encounter (Signed)
Patient advised of recommendations.  

## 2016-12-21 NOTE — Telephone Encounter (Signed)
We can certainly try to for medication and see if it makes a difference. Extensive stop Benicar. We'll try amlodipine in its place. He will need to come in to 3 weeks for nurse blood pressure check just to make sure it is adequately controlling his blood pressure.  Beatrice Lecher, MD

## 2017-03-06 ENCOUNTER — Ambulatory Visit: Payer: BLUE CROSS/BLUE SHIELD | Admitting: Family Medicine

## 2017-04-02 ENCOUNTER — Ambulatory Visit (INDEPENDENT_AMBULATORY_CARE_PROVIDER_SITE_OTHER): Payer: BLUE CROSS/BLUE SHIELD | Admitting: Family Medicine

## 2017-04-02 ENCOUNTER — Encounter: Payer: Self-pay | Admitting: Family Medicine

## 2017-04-02 VITALS — BP 133/78 | HR 59 | Wt 218.0 lb

## 2017-04-02 DIAGNOSIS — M7062 Trochanteric bursitis, left hip: Secondary | ICD-10-CM

## 2017-04-02 NOTE — Patient Instructions (Signed)
Ice for about 10 minutes when she got home and then ice again at bedtime. Review the attached handout for the exercises and stretches. Consider these after a couple of days.

## 2017-04-02 NOTE — Progress Notes (Signed)
   Subjective:    Patient ID: Glenn Meyer, male    DOB: 1951/05/07, 66 y.o.   MRN: [id number]  HPI 66 year old male comes in today complaining of left outer hip pain. He says been going on for at least 2 months. It bothers him mostly at night when he tries to turn over and sleep on that side. He is a side sleeper so he finds is really frustrating. He says he'll wake up with a dull ache and it actually wakes him up at times. He's been using some Aleve p.m. the last 2 weeks and that does seem to help some. He also bought an extra cushion for a bed top her and that seems to be helping a little as well. He denies any known trauma or injury. He does work long hours and seen on concrete floor most of the day. If he's had a long work take it will bother him then as well. He has tried icing it a couple of times.   Review of Systems     Objective:   Physical Exam  Constitutional: He is oriented to person, place, and time. He appears well-developed and well-nourished.  HENT:  Head: Normocephalic and atraumatic.  Eyes: Conjunctivae and EOM are normal.  Cardiovascular: Normal rate.   Pulmonary/Chest: Effort normal.  Musculoskeletal:  Left hip with normal range of motion. Hip, knee, ankle strength is 5 out of 5. He does have some week abduction of the left leg. Tender directly over the greater trochanter. No pain with internal and external rotation of the hip.  Neurological: He is alert and oriented to person, place, and time.  Skin: Skin is dry. No pallor.  Psychiatric: He has a normal mood and affect. His behavior is normal.  Vitals reviewed.      Assessment & Plan:  Left trochanteric bursitis-discussed treatment options including icing, anti-inflammatory and stretches. Also offered to do an injection today.  Start exercising in 2-3 days.  Call ifnot better in 1-2 weeks. Follow up wound care discussed. Ice pack given in the office today.   Bursa Injection Procedure Note  Pre-operative  Diagnosis: left Trochanteric bursitis  Post-operative Diagnosis: same  Indications: Diagnosis and treatment of symptomatic bursal effusion  Anesthesia: not required with added sodium bicarbonate  Procedure Details   After a discussion of the risks and benefits with the patient (including the possibility that any manipulation of the bursa could introduce infection, worsening the current situation significantly), verbal consent was obtained for the procedure. The joint was prepped with chlorhexadine . Marland Kitchen An 18 gauge needle was introduced into the bursad space and 40mg  kenalog and 9 ml lidocaine injected.  The dressing was applied.  Complications:  None; patient tolerated the procedure well.

## 2017-07-10 ENCOUNTER — Other Ambulatory Visit: Payer: Self-pay | Admitting: Family Medicine

## 2017-07-31 ENCOUNTER — Ambulatory Visit (INDEPENDENT_AMBULATORY_CARE_PROVIDER_SITE_OTHER): Payer: BLUE CROSS/BLUE SHIELD | Admitting: Family Medicine

## 2017-07-31 ENCOUNTER — Encounter: Payer: Self-pay | Admitting: Family Medicine

## 2017-07-31 VITALS — BP 127/73 | HR 60 | Ht 76.0 in | Wt 219.0 lb

## 2017-07-31 DIAGNOSIS — Z23 Encounter for immunization: Secondary | ICD-10-CM

## 2017-07-31 DIAGNOSIS — I10 Essential (primary) hypertension: Secondary | ICD-10-CM | POA: Diagnosis not present

## 2017-07-31 DIAGNOSIS — R7301 Impaired fasting glucose: Secondary | ICD-10-CM | POA: Diagnosis not present

## 2017-07-31 DIAGNOSIS — R202 Paresthesia of skin: Secondary | ICD-10-CM

## 2017-07-31 LAB — POCT GLYCOSYLATED HEMOGLOBIN (HGB A1C): Hemoglobin A1C: 5.6

## 2017-07-31 MED ORDER — PREDNISONE 20 MG PO TABS: 40.0000 mg | ORAL_TABLET | Freq: Every day | ORAL | 0 refills | Status: DC

## 2017-07-31 NOTE — Patient Instructions (Signed)
If not improving with the prednisone let us know and we'll get she went with one of our sports medicine providers.

## 2017-07-31 NOTE — Progress Notes (Signed)
Subjective:    CC: glucose and BP.   HPI:  Impaired fasting glucose-no increased thirst or urination. No symptoms consistent with hypoglycemia.  Hypertension- Pt denies chest pain, SOB, dizziness, or heart palpitations.  Taking meds as directed w/o problems.  Denies medication side effects.  Just on amlodipine.    He also discussed that he's been getting some tingling-most numbness sensation starting in the left side of his neck going down towards his left shoulder. It does not travel down his arm or up into the jaw. It's been going on for about 3-4 weeks. No known trauma or injury. He's not had any significant neck pain or shoulder pain and he says the sensation is not really painful it just tingles. It will usually last about a minute and then resolved pretty quickly though often it happens 2-3 times during the day. It's usually when he is moving and active and not at rest. He has not experienced any chest pain or shortness of breath. He exercises daily and has not noticed any weakness in his extremities.  Past medical history, Surgical history, Family history not pertinant except as noted below, Social history, Allergies, and medications have been entered into the medical record, reviewed, and corrections made.   Review of Systems: No fevers, chills, night sweats, weight loss, chest pain, or shortness of breath.   Objective:    General: Well Developed, well nourished, and in no acute distress.  Neuro: Alert and oriented x3, extra-ocular muscles intact, sensation grossly intact.  HEENT: Normocephalic, atraumatic , Oropharynx is clear, TMs and canals are clear bilaterally. Skin: Warm and dry, no rashes. Cardiac: Regular rate and rhythm, no murmurs rubs or gallops, no lower extremity edema. No carotid bruits. Respiratory: Clear to auscultation bilaterally. Not using accessory muscles, speaking in full sentences. MSK: Neck with normal flexion, extension, rotation right and left and side  bending. Nontender over the cervical spine. He is a little bit of tightness in the upper trapezius muscle just between the edge of the shoulder and the neck. Shoulders with normal range of motion bilaterally.   Impression and Recommendations:   Tingling on the left side of his neck down to the shoulder-suspect related to either a pinched nerve, C4?,  or irritation/inflammation of trapezius muscle. He does have some firmness in the muscle tissue. Carotids are normal with no bruits. Neck and shoulder exam are completely normal. Try a 5 day course of prednisone for possible irritation of the nerve..  IFG - 11 A1c of 5.6 which looks fantastic. Continue to work out regularly and eat healthy. Follow-up in 6 months.  HTN -Well controlled. Continue current regimen. Follow up in  6 months.   Due for screening PSA.

## 2017-08-31 LAB — COMPLETE METABOLIC PANEL WITH GFR
AG RATIO: 1.6 (calc) (ref 1.0–2.5)
ALBUMIN MSPROF: 4.2 g/dL (ref 3.6–5.1)
ALKALINE PHOSPHATASE (APISO): 65 U/L (ref 40–115)
ALT: 28 U/L (ref 9–46)
AST: 28 U/L (ref 10–35)
BILIRUBIN TOTAL: 0.5 mg/dL (ref 0.2–1.2)
BUN: 19 mg/dL (ref 7–25)
CHLORIDE: 104 mmol/L (ref 98–110)
CO2: 28 mmol/L (ref 20–32)
CREATININE: 1.02 mg/dL (ref 0.70–1.25)
Calcium: 9.5 mg/dL (ref 8.6–10.3)
GFR, Est African American: 90 mL/min/{1.73_m2} (ref >=60)
GFR, Est Non African American: 77 mL/min/{1.73_m2} (ref >=60)
GLOBULIN: 2.6 g/dL (ref 1.9–3.7)
Glucose, Bld: 108 mg/dL — ABNORMAL HIGH (ref 65–99)
POTASSIUM: 5.4 mmol/L — AB (ref 3.5–5.3)
Sodium: 140 mmol/L (ref 135–146)
Total Protein: 6.8 g/dL (ref 6.1–8.1)

## 2017-08-31 LAB — CBC WITH DIFFERENTIAL/PLATELET
BASOS ABS: 38 {cells}/uL (ref 0–200)
Basophils Relative: 0.8 %
EOS ABS: 127 {cells}/uL (ref 15–500)
Eosinophils Relative: 2.7 %
HEMATOCRIT: 46.3 % (ref 38.5–50.0)
HEMOGLOBIN: 15.5 g/dL (ref 13.2–17.1)
LYMPHS ABS: 1509 {cells}/uL (ref 850–3900)
MCH: 28.7 pg (ref 27.0–33.0)
MCHC: 33.5 g/dL (ref 32.0–36.0)
MCV: 85.7 fL (ref 80.0–100.0)
MPV: 12.7 fL — ABNORMAL HIGH (ref 7.5–12.5)
Monocytes Relative: 11 %
NEUTROS ABS: 2510 {cells}/uL (ref 1500–7800)
Neutrophils Relative %: 53.4 %
Platelets: 206 10*3/uL (ref 140–400)
RBC: 5.4 10*6/uL (ref 4.20–5.80)
RDW: 12.8 % (ref 11.0–15.0)
Total Lymphocyte: 32.1 %
WBC mixed population: 517 cells/uL (ref 200–950)
WBC: 4.7 10*3/uL (ref 3.8–10.8)

## 2017-08-31 LAB — LIPID PANEL W/REFLEX DIRECT LDL
CHOL/HDL RATIO: 4.3 (calc) (ref <5.0)
CHOLESTEROL: 215 mg/dL — AB (ref <200)
HDL: 50 mg/dL (ref >40)
LDL Cholesterol (Calc): 144 mg/dL (calc) — ABNORMAL HIGH
Non-HDL Cholesterol (Calc): 165 mg/dL (calc) — ABNORMAL HIGH (ref <130)
Triglycerides: 98 mg/dL (ref <150)

## 2017-08-31 LAB — PSA: PSA: 1.7 ng/mL (ref <=4.0)

## 2017-08-31 LAB — TSH: TSH: 1.38 m[IU]/L (ref 0.40–4.50)

## 2017-09-03 ENCOUNTER — Telehealth: Payer: Self-pay | Admitting: *Deleted

## 2017-09-03 DIAGNOSIS — E875 Hyperkalemia: Secondary | ICD-10-CM

## 2017-09-03 NOTE — Telephone Encounter (Signed)
Lab orders placed.  

## 2017-09-13 ENCOUNTER — Emergency Department (INDEPENDENT_AMBULATORY_CARE_PROVIDER_SITE_OTHER): Payer: BLUE CROSS/BLUE SHIELD

## 2017-09-13 ENCOUNTER — Other Ambulatory Visit: Payer: Self-pay

## 2017-09-13 ENCOUNTER — Emergency Department (INDEPENDENT_AMBULATORY_CARE_PROVIDER_SITE_OTHER)
Admission: EM | Admit: 2017-09-13 | Discharge: 2017-09-13 | Disposition: A | Discharge status: Home / Self Care | Payer: BLUE CROSS/BLUE SHIELD | Source: Home / Self Care | Attending: Family Medicine | Admitting: Family Medicine

## 2017-09-13 ENCOUNTER — Encounter: Payer: Self-pay | Admitting: Emergency Medicine

## 2017-09-13 DIAGNOSIS — Z8701 Personal history of pneumonia (recurrent): Secondary | ICD-10-CM

## 2017-09-13 DIAGNOSIS — R05 Cough: Secondary | ICD-10-CM | POA: Diagnosis not present

## 2017-09-13 DIAGNOSIS — R0989 Other specified symptoms and signs involving the circulatory and respiratory systems: Secondary | ICD-10-CM | POA: Diagnosis not present

## 2017-09-13 DIAGNOSIS — J069 Acute upper respiratory infection, unspecified: Secondary | ICD-10-CM

## 2017-09-13 DIAGNOSIS — B9789 Other viral agents as the cause of diseases classified elsewhere: Secondary | ICD-10-CM

## 2017-09-13 MED ORDER — GUAIFENESIN ER 600 MG PO TB12: 600.0000 mg | ORAL_TABLET | Freq: Two times a day (BID) | ORAL | 1 refills | Status: DC | PRN

## 2017-09-13 MED ORDER — AZITHROMYCIN 250 MG PO TABS: 250.0000 mg | ORAL_TABLET | Freq: Every day | ORAL | 0 refills | Status: DC

## 2017-09-13 MED ORDER — BENZONATATE 100 MG PO CAPS: 100.0000 mg | ORAL_CAPSULE | Freq: Three times a day (TID) | ORAL | 0 refills | Status: DC

## 2017-09-13 NOTE — ED Provider Notes (Signed)
[facility]    CSN: [id number] Arrival date & time: 09/13/17  1349     History   Chief Complaint Chief Complaint  Patient presents with  . Cough    HPI Glenn Meyer is a 66 y.o. male.   HPI  Glenn Meyer is a 66 y.o. male presenting to UC with c/o chest congestion with cough for 2 days.  Hx of pneumonia at least twice in the past. He was advised by his PCP that each time he gets pneumonia, it could happen more easily the next time. Pt wants to make sure he does not get pneumonia again. Denies fever, chills, n/v/d. Denies chest pain or SOB. Denies hx of asthma or COPD. He has never needed to be hospitalized for his pneumonia.    Past Medical History:  Diagnosis Date  . Hyperlipidemia   . Hypertension   . Ringing of ears    chronic X 20 years  . Tinnitus   . TMJ (dislocation of temporomandibular joint)     Patient Active Problem List   Diagnosis Date Noted  . Degenerative arthritis of left hip 10/03/2012  . SHOULDER PAIN, RIGHT 03/17/2008  . Hyperlipidemia 08/08/2007  . HYPERTENSION, BENIGN ESSENTIAL 07/30/2007  . GERD 07/30/2007    Past Surgical History:  Procedure Laterality Date  . TONSILECTOMY, ADENOIDECTOMY, BILATERAL MYRINGOTOMY AND TUBES    . TONSILLECTOMY     age 44       Home Medications    Prior to Admission medications   Medication Sig Start Date End Date Taking? Authorizing Provider  amLODipine (NORVASC) 5 MG tablet TAKE 1 TABLET DAILY 07/10/17   Hali Marry, MD  ARGININE2000 PO Take 2,000 mg by mouth daily.    [provider]  azithromycin (ZITHROMAX) 250 MG tablet Take 1 tablet (250 mg total) by mouth daily. Take first 2 tablets together, then 1 every day until finished. 09/13/17   Noe Gens, PA-C  benzonatate (TESSALON) 100 MG capsule Take 1-2 capsules (100-200 mg total) by mouth every 8 (eight) hours. 09/13/17   Noe Gens, PA-C  guaiFENesin (MUCINEX) 600 MG 12 hr tablet Take 1-2 tablets (600-1,200 mg  total) by mouth 2 (two) times daily as needed for cough or to loosen phlegm. Take with large glass of water 09/13/17   Noe Gens, PA-C  Multiple Vitamin (MULTIVITAMIN) capsule Take 1 capsule by mouth daily.      [provider]  Omega-3 Fatty Acids (FISH OIL) 1000 MG CAPS Take by mouth.      [provider]  predniSONE (DELTASONE) 20 MG tablet Take 2 tablets (40 mg total) by mouth daily. 07/31/17   Hali Marry, MD  sildenafil (REVATIO) 20 MG tablet Take 20 mg by mouth. 07/10/17   [provider]    Family History Family History  Problem Relation Age of Onset  . Hypertension Mother   . Hyperlipidemia Mother   . Kidney disease Mother        congenital/diaylisis 67's  . Heart disease Father        bypass 2000  . Hypertension Unknown   . Hyperlipidemia Unknown     Social History Social History   Tobacco Use  . Smoking status: Former Smoker    Years: 3.00    Types: Cigarettes    Last attempt to quit: 10/22/1972    Years since quitting: 44.9  . Smokeless tobacco: Never Used  . Tobacco comment: quit 34 years ago  Substance  Use Topics  . Alcohol use: Yes    Alcohol/week: 4.2 oz    Types: 7 Standard drinks or equivalent per week    Comment: per week  . Drug use: No     Allergies   Lisinopril-hydrochlorothiazide and Meperidine hcl   Review of Systems Review of Systems  Constitutional: Negative for chills and fever.  HENT: Positive for congestion. Negative for ear pain, sore throat, trouble swallowing and voice change.   Respiratory: Positive for cough. Negative for shortness of breath.   Cardiovascular: Negative for chest pain and palpitations.  Gastrointestinal: Negative for abdominal pain, diarrhea, nausea and vomiting.  Musculoskeletal: Negative for arthralgias, back pain and myalgias.  Skin: Negative for rash.     Physical Exam Triage Vital Signs ED Triage Vitals  Enc Vitals Group     BP 09/13/17 1413 135/76     Pulse Rate  09/13/17 1413 66     Resp 09/13/17 1413 16     Temp 09/13/17 1413 98.7 F (37.1 C)     Temp Source 09/13/17 1413 Oral     SpO2 09/13/17 1413 97 %     Weight 09/13/17 1414 215 lb (97.5 kg)     Height 09/13/17 1414 6\' 4"  (1.93 m)     Head Circumference --      Peak Flow --      Pain Score --      Pain Loc --      Pain Edu? --      Excl. in [place]? --    No data found.  Updated Vital Signs BP 135/76 (BP Location: Right Arm)   Pulse 66   Temp 98.7 F (37.1 C) (Oral)   Resp 16   Ht 6\' 4"  (1.93 m)   Wt 215 lb (97.5 kg)   SpO2 97%   BMI 26.17 kg/m   Visual Acuity Right Eye Distance:   Left Eye Distance:   Bilateral Distance:    Right Eye Near:   Left Eye Near:    Bilateral Near:     Physical Exam  Constitutional: He is oriented to person, place, and time. He appears well-developed and well-nourished. No distress.  HENT:  Head: Normocephalic and atraumatic.  Right Ear: Tympanic membrane normal.  Left Ear: Tympanic membrane normal.  Nose: Mucosal edema present. Right sinus exhibits no maxillary sinus tenderness and no frontal sinus tenderness. Left sinus exhibits no maxillary sinus tenderness and no frontal sinus tenderness.  Mouth/Throat: Uvula is midline, oropharynx is clear and moist and mucous membranes are normal.  Eyes: EOM are normal.  Neck: Normal range of motion. Neck supple.  Cardiovascular: Normal rate and regular rhythm.  Pulmonary/Chest: Effort normal and breath sounds normal. No stridor. No respiratory distress. He has no wheezes. He has no rales.  Musculoskeletal: Normal range of motion.  Lymphadenopathy:    He has no cervical adenopathy.  Neurological: He is alert and oriented to person, place, and time.  Skin: Skin is warm and dry. He is not diaphoretic.  Psychiatric: He has a normal mood and affect. His behavior is normal.  Nursing note and vitals reviewed.    UC Treatments / Results  Labs (all labs ordered are listed, but only abnormal results are  displayed) Labs Reviewed - No data to display  EKG  EKG Interpretation None       Radiology Dg Chest 2 View  Result Date: 09/13/2017 CLINICAL DATA:  Patient with cough and congestion. EXAM: CHEST  2 VIEW COMPARISON:  Chest radiograph  10/16/2008 FINDINGS: Stable cardiac and mediastinal contours. No consolidative pulmonary opacities. No pleural effusion or pneumothorax. Thoracic spine degenerative changes. IMPRESSION: No acute cardiopulmonary process. Electronically Signed   By: Lovey Newcomer M.D.   On: 09/13/2017 14:43    Procedures Procedures (including critical care time)  Medications Ordered in UC Medications - No data to display   Initial Impression / Assessment and Plan / UC Course  I have reviewed the triage vital signs and the nursing notes.  Pertinent labs & imaging results that were available during my care of the patient were reviewed by me and considered in my medical decision making (see chart for details).     Hx and exam c/w viral illness CXR: WNL Reassured pt of normal CXR Encouraged symptomatic treatment including fluids, rest, and mucinex to help thin mucous to cough it up.  May take Tessalon at night to help with cough and sleep. F/u with PCP in 1 week if not improving. Prescription to hold with expiration date for azithromycin. Pt to fill if persistent fever develops or not improving in 1 week.    Final Clinical Impressions(s) / UC Diagnoses   Final diagnoses:  Viral URI with cough  History of pneumonia    ED Discharge Orders        Ordered    benzonatate (TESSALON) 100 MG capsule  Every 8 hours     09/13/17 1454    azithromycin (ZITHROMAX) 250 MG tablet  Daily    Comments:  Void after 10/05/17   09/13/17 1454    guaiFENesin (MUCINEX) 600 MG 12 hr tablet  2 times daily PRN     09/13/17 1454       Controlled Substance Prescriptions  Controlled Substance Registry consulted? Not Applicable   Tyrell Antonio 09/13/17 1549

## 2017-09-13 NOTE — ED Triage Notes (Signed)
Patient has felt he is congested in lungs along with cough for 2 days; because he has had pneumonia in past, he is here to avert this from happening again.

## 2017-09-13 NOTE — Discharge Instructions (Signed)
°  You may take 500mg  acetaminophen every 4-6 hours or in combination with ibuprofen 400-600mg  every 6-8 hours as needed for pain, inflammation, and fever.  Be sure to drink at least eight 8oz glasses of water to stay well hydrated and get at least 8 hours of sleep at night, preferably more while sick.   Your symptoms are likely due to a virus such as the common cold, however, if you developing worsening chest congestion with shortness of breath, persistent fever (>100.66F) for 3 days, or symptoms not improving in 4-5 days, you may fill the antibiotic (azithromycin).  If you do fill the antibiotic,  please take antibiotics as prescribed and be sure to complete entire course even if you start to feel better to ensure infection does not come back.

## 2017-09-15 ENCOUNTER — Telehealth: Payer: Self-pay | Admitting: Emergency Medicine

## 2017-09-15 NOTE — Telephone Encounter (Signed)
Courtesy call to patient. LM and advised to CB with questions or concerns.  

## 2017-09-18 ENCOUNTER — Other Ambulatory Visit: Payer: Self-pay | Admitting: Family Medicine

## 2017-10-18 ENCOUNTER — Other Ambulatory Visit: Payer: Self-pay | Admitting: Family Medicine

## 2017-11-07 ENCOUNTER — Emergency Department
Admission: EM | Admit: 2017-11-07 | Discharge: 2017-11-07 | Disposition: A | Discharge status: Home / Self Care | Payer: Medicare Other | Source: Home / Self Care | Attending: Family Medicine | Admitting: Family Medicine

## 2017-11-07 ENCOUNTER — Other Ambulatory Visit: Payer: Self-pay

## 2017-11-07 DIAGNOSIS — J069 Acute upper respiratory infection, unspecified: Secondary | ICD-10-CM | POA: Diagnosis not present

## 2017-11-07 DIAGNOSIS — B9789 Other viral agents as the cause of diseases classified elsewhere: Secondary | ICD-10-CM | POA: Diagnosis not present

## 2017-11-07 MED ORDER — AZITHROMYCIN 250 MG PO TABS: 250.0000 mg | ORAL_TABLET | Freq: Every day | ORAL | 0 refills | Status: DC

## 2017-11-07 MED ORDER — BENZONATATE 100 MG PO CAPS: 100.0000 mg | ORAL_CAPSULE | Freq: Three times a day (TID) | ORAL | 0 refills | Status: DC

## 2017-11-07 MED ORDER — GUAIFENESIN ER 600 MG PO TB12: 600.0000 mg | ORAL_TABLET | Freq: Two times a day (BID) | ORAL | 0 refills | Status: DC | PRN

## 2017-11-07 NOTE — Discharge Instructions (Signed)
°  Your symptoms are likely due to a virus such as the common cold, however, if you developing worsening chest congestion with shortness of breath, persistent fever (> 100.4*F) for 3 days, or symptoms not improving in 4-5 days, you may fill the antibiotic (azithromycin).  If you do fill the antibiotic,  please take antibiotics as prescribed and be sure to complete entire course even if you start to feel better to ensure infection does not come back. ° °

## 2017-11-07 NOTE — ED Triage Notes (Signed)
Started about 2 days ago with cold sx, productive cough, feverish last night, feels like it has settled in his chest.

## 2017-11-07 NOTE — ED Provider Notes (Signed)
[facility]    CSN: [id number] Arrival date & time: 11/07/17  0804     History   Chief Complaint Chief Complaint  Patient presents with  . Cough    HPI Glenn Meyer is a 67 y.o. male.   HPI  Glenn Meyer is a 67 y.o. male presenting to UC with c/o 2 days of mildly productive cough that started after he returned from a road trip to [place], [place] for the day for a work meeting.  He feels his congestion has settled into his chest.  Hx of pneumonia in the past.  Pt is going out of town again next week and hopes to be better by then. He has taken Claritin w/o relief.  Denies fever, chills, n/v/d. Denies chest pain or SOB. No throat or ear pain but he has had a mild HA that improved with ibuprofen.  No known sick contacts. A Z-pack has helped in the past.    Past Medical History:  Diagnosis Date  . Hyperlipidemia   . Hypertension   . Ringing of ears    chronic X 20 years  . Tinnitus   . TMJ (dislocation of temporomandibular joint)     Patient Active Problem List   Diagnosis Date Noted  . Degenerative arthritis of left hip 10/03/2012  . SHOULDER PAIN, RIGHT 03/17/2008  . Hyperlipidemia 08/08/2007  . HYPERTENSION, BENIGN ESSENTIAL 07/30/2007  . GERD 07/30/2007    Past Surgical History:  Procedure Laterality Date  . TONSILECTOMY, ADENOIDECTOMY, BILATERAL MYRINGOTOMY AND TUBES    . TONSILLECTOMY     age 6       Home Medications    Prior to Admission medications   Medication Sig Start Date End Date Taking? Authorizing Provider  amLODipine (NORVASC) 5 MG tablet TAKE 1 TABLET DAILY 10/18/17   Hali Marry, MD  ARGININE2000 PO Take 2,000 mg by mouth daily.    [provider]  azithromycin (ZITHROMAX) 250 MG tablet Take 1 tablet (250 mg total) by mouth daily. Take first 2 tablets together, then 1 every day until finished. 11/07/17   Noe Gens, PA-C  benzonatate (TESSALON) 100 MG capsule Take 1-2 capsules (100-200 mg total) by mouth every 8  (eight) hours. 11/07/17   Noe Gens, PA-C  guaiFENesin (MUCINEX) 600 MG 12 hr tablet Take 1-2 tablets (600-1,200 mg total) by mouth 2 (two) times daily as needed for cough or to loosen phlegm. Take with large glass of water 11/07/17   Noe Gens, PA-C  Multiple Vitamin (MULTIVITAMIN) capsule Take 1 capsule by mouth daily.      [provider]  Omega-3 Fatty Acids (FISH OIL) 1000 MG CAPS Take by mouth.      [provider]  predniSONE (DELTASONE) 20 MG tablet Take 2 tablets (40 mg total) by mouth daily. 07/31/17   Hali Marry, MD  sildenafil (REVATIO) 20 MG tablet Take 20 mg by mouth. 07/10/17   [provider]    Family History Family History  Problem Relation Age of Onset  . Hypertension Mother   . Hyperlipidemia Mother   . Kidney disease Mother        congenital/diaylisis 31's  . Heart disease Father        bypass 2000  . Hypertension Unknown   . Hyperlipidemia Unknown     Social History Social History   Tobacco Use  . Smoking status: Former Smoker    Years: 3.00    Types: Cigarettes  Last attempt to quit: 10/22/1972    Years since quitting: 45.0  . Smokeless tobacco: Never Used  . Tobacco comment: quit 34 years ago  Substance Use Topics  . Alcohol use: Yes    Alcohol/week: 4.2 oz    Types: 7 Standard drinks or equivalent per week    Comment: per week  . Drug use: No     Allergies   Lisinopril-hydrochlorothiazide and Meperidine hcl   Review of Systems Review of Systems  Constitutional: Negative for chills and fever.  HENT: Positive for congestion and postnasal drip. Negative for ear pain, sore throat, trouble swallowing and voice change.   Respiratory: Positive for cough. Negative for shortness of breath.   Cardiovascular: Negative for chest pain and palpitations.  Gastrointestinal: Negative for abdominal pain, diarrhea, nausea and vomiting.  Musculoskeletal: Negative for arthralgias, back pain and myalgias.  Skin:  Negative for rash.  Neurological: Positive for headaches. Negative for dizziness and light-headedness.     Physical Exam Triage Vital Signs ED Triage Vitals  Enc Vitals Group     BP 11/07/17 0818 (!) 149/88     Pulse Rate 11/07/17 0818 68     Resp --      Temp 11/07/17 0818 98.3 F (36.8 C)     Temp Source 11/07/17 0818 Oral     SpO2 11/07/17 0818 99 %     Weight 11/07/17 0819 221 lb (100.2 kg)     Height 11/07/17 0819 6\' 4"  (1.93 m)     Head Circumference --      Peak Flow --      Pain Score 11/07/17 0819 0     Pain Loc --      Pain Edu? --      Excl. in [place]? --    No data found.  Updated Vital Signs BP (!) 149/88 (BP Location: Right Arm)   Pulse 68   Temp 98.3 F (36.8 C) (Oral)   Ht 6\' 4"  (1.93 m)   Wt 221 lb (100.2 kg)   SpO2 99%   BMI 26.90 kg/m   Visual Acuity Right Eye Distance:   Left Eye Distance:   Bilateral Distance:    Right Eye Near:   Left Eye Near:    Bilateral Near:     Physical Exam  Constitutional: He is oriented to person, place, and time. He appears well-developed and well-nourished. No distress.  HENT:  Head: Normocephalic and atraumatic.  Right Ear: Tympanic membrane normal.  Left Ear: Tympanic membrane normal.  Nose: Nose normal.  Mouth/Throat: Uvula is midline, oropharynx is clear and moist and mucous membranes are normal.  Eyes: EOM are normal.  Neck: Normal range of motion. Neck supple.  Cardiovascular: Normal rate and regular rhythm.  Pulmonary/Chest: Effort normal and breath sounds normal. No stridor. No respiratory distress. He has no wheezes. He has no rales.  Musculoskeletal: Normal range of motion.  Lymphadenopathy:    He has no cervical adenopathy.  Neurological: He is alert and oriented to person, place, and time.  Skin: Skin is warm and dry. He is not diaphoretic.  Psychiatric: He has a normal mood and affect. His behavior is normal.  Nursing note and vitals reviewed.    UC Treatments / Results  Labs (all labs  ordered are listed, but only abnormal results are displayed) Labs Reviewed - No data to display  EKG  EKG Interpretation None       Radiology No results found.  Procedures Procedures (including critical care time)  Medications Ordered in UC Medications - No data to display   Initial Impression / Assessment and Plan / UC Course  I have reviewed the triage vital signs and the nursing notes.  Pertinent labs & imaging results that were available during my care of the patient were reviewed by me and considered in my medical decision making (see chart for details).     Hx and exam c/w viral illness such as the common cold Strongly encouraged symptomatic treatment at this time. Prescription to hold with expiration date for azithromycin. Pt to fill if persistent fever develops or not improving in 1 week.    Final Clinical Impressions(s) / UC Diagnoses   Final diagnoses:  Viral URI with cough    ED Discharge Orders        Ordered    benzonatate (TESSALON) 100 MG capsule  Every 8 hours     11/07/17 0836    guaiFENesin (MUCINEX) 600 MG 12 hr tablet  2 times daily PRN     11/07/17 0836    azithromycin (ZITHROMAX) 250 MG tablet  Daily    Comments:  Void after 11/20/17   11/07/17 0836       Controlled Substance Prescriptions [place] Controlled Substance Registry consulted? Not Applicable   Tyrell Antonio 11/07/17 6962

## 2017-11-18 ENCOUNTER — Other Ambulatory Visit: Payer: Self-pay | Admitting: Family Medicine

## 2017-12-06 DIAGNOSIS — M25552 Pain in left hip: Secondary | ICD-10-CM | POA: Diagnosis not present

## 2017-12-06 DIAGNOSIS — M9903 Segmental and somatic dysfunction of lumbar region: Secondary | ICD-10-CM | POA: Diagnosis not present

## 2017-12-06 DIAGNOSIS — M9904 Segmental and somatic dysfunction of sacral region: Secondary | ICD-10-CM | POA: Diagnosis not present

## 2017-12-06 DIAGNOSIS — M545 Low back pain: Secondary | ICD-10-CM | POA: Diagnosis not present

## 2017-12-06 DIAGNOSIS — M9905 Segmental and somatic dysfunction of pelvic region: Secondary | ICD-10-CM | POA: Diagnosis not present

## 2017-12-09 DIAGNOSIS — M9904 Segmental and somatic dysfunction of sacral region: Secondary | ICD-10-CM | POA: Diagnosis not present

## 2017-12-09 DIAGNOSIS — M9905 Segmental and somatic dysfunction of pelvic region: Secondary | ICD-10-CM | POA: Diagnosis not present

## 2017-12-09 DIAGNOSIS — M545 Low back pain: Secondary | ICD-10-CM | POA: Diagnosis not present

## 2017-12-09 DIAGNOSIS — M9903 Segmental and somatic dysfunction of lumbar region: Secondary | ICD-10-CM | POA: Diagnosis not present

## 2017-12-09 DIAGNOSIS — M25552 Pain in left hip: Secondary | ICD-10-CM | POA: Diagnosis not present

## 2017-12-16 ENCOUNTER — Other Ambulatory Visit: Payer: Self-pay | Admitting: Chiropractic Medicine

## 2017-12-16 DIAGNOSIS — M9901 Segmental and somatic dysfunction of cervical region: Secondary | ICD-10-CM

## 2017-12-16 DIAGNOSIS — M9904 Segmental and somatic dysfunction of sacral region: Secondary | ICD-10-CM | POA: Diagnosis not present

## 2017-12-16 DIAGNOSIS — M545 Low back pain: Secondary | ICD-10-CM | POA: Diagnosis not present

## 2017-12-16 DIAGNOSIS — M9905 Segmental and somatic dysfunction of pelvic region: Secondary | ICD-10-CM | POA: Diagnosis not present

## 2017-12-16 DIAGNOSIS — M25552 Pain in left hip: Secondary | ICD-10-CM | POA: Diagnosis not present

## 2017-12-16 DIAGNOSIS — M9903 Segmental and somatic dysfunction of lumbar region: Secondary | ICD-10-CM | POA: Diagnosis not present

## 2017-12-25 ENCOUNTER — Telehealth: Payer: Self-pay | Admitting: Family Medicine

## 2017-12-25 DIAGNOSIS — M542 Cervicalgia: Secondary | ICD-10-CM

## 2017-12-25 NOTE — Telephone Encounter (Signed)
Call patient: Order placed for cervical x-ray.  He can go downstairs at his convenience.  Just asked them to burn a copy onto a CD for him so that he can take it to Dr. Lowella Dell office.

## 2017-12-25 NOTE — Telephone Encounter (Signed)
Pt advised. He will complete.

## 2017-12-27 ENCOUNTER — Ambulatory Visit (INDEPENDENT_AMBULATORY_CARE_PROVIDER_SITE_OTHER): Payer: Medicare Other

## 2017-12-27 DIAGNOSIS — M545 Low back pain: Secondary | ICD-10-CM | POA: Diagnosis not present

## 2017-12-27 DIAGNOSIS — M25552 Pain in left hip: Secondary | ICD-10-CM | POA: Diagnosis not present

## 2017-12-27 DIAGNOSIS — M9904 Segmental and somatic dysfunction of sacral region: Secondary | ICD-10-CM | POA: Diagnosis not present

## 2017-12-27 DIAGNOSIS — M9905 Segmental and somatic dysfunction of pelvic region: Secondary | ICD-10-CM | POA: Diagnosis not present

## 2017-12-27 DIAGNOSIS — M4802 Spinal stenosis, cervical region: Secondary | ICD-10-CM | POA: Diagnosis not present

## 2017-12-27 DIAGNOSIS — M9903 Segmental and somatic dysfunction of lumbar region: Secondary | ICD-10-CM | POA: Diagnosis not present

## 2017-12-27 DIAGNOSIS — M542 Cervicalgia: Secondary | ICD-10-CM | POA: Diagnosis not present

## 2017-12-27 DIAGNOSIS — M50321 Other cervical disc degeneration at C4-C5 level: Secondary | ICD-10-CM

## 2018-01-01 DIAGNOSIS — M9903 Segmental and somatic dysfunction of lumbar region: Secondary | ICD-10-CM | POA: Diagnosis not present

## 2018-01-01 DIAGNOSIS — M9904 Segmental and somatic dysfunction of sacral region: Secondary | ICD-10-CM | POA: Diagnosis not present

## 2018-01-01 DIAGNOSIS — M545 Low back pain: Secondary | ICD-10-CM | POA: Diagnosis not present

## 2018-01-01 DIAGNOSIS — M25552 Pain in left hip: Secondary | ICD-10-CM | POA: Diagnosis not present

## 2018-01-01 DIAGNOSIS — M9905 Segmental and somatic dysfunction of pelvic region: Secondary | ICD-10-CM | POA: Diagnosis not present

## 2018-01-15 DIAGNOSIS — M9902 Segmental and somatic dysfunction of thoracic region: Secondary | ICD-10-CM | POA: Diagnosis not present

## 2018-01-15 DIAGNOSIS — M25552 Pain in left hip: Secondary | ICD-10-CM | POA: Diagnosis not present

## 2018-01-15 DIAGNOSIS — M9901 Segmental and somatic dysfunction of cervical region: Secondary | ICD-10-CM | POA: Diagnosis not present

## 2018-01-15 DIAGNOSIS — M4003 Postural kyphosis, cervicothoracic region: Secondary | ICD-10-CM | POA: Diagnosis not present

## 2018-01-15 DIAGNOSIS — M9905 Segmental and somatic dysfunction of pelvic region: Secondary | ICD-10-CM | POA: Diagnosis not present

## 2018-01-15 DIAGNOSIS — M9907 Segmental and somatic dysfunction of upper extremity: Secondary | ICD-10-CM | POA: Diagnosis not present

## 2018-01-15 DIAGNOSIS — M9903 Segmental and somatic dysfunction of lumbar region: Secondary | ICD-10-CM | POA: Diagnosis not present

## 2018-01-15 DIAGNOSIS — M531 Cervicobrachial syndrome: Secondary | ICD-10-CM | POA: Diagnosis not present

## 2018-01-15 DIAGNOSIS — M542 Cervicalgia: Secondary | ICD-10-CM | POA: Diagnosis not present

## 2018-01-22 DIAGNOSIS — M4003 Postural kyphosis, cervicothoracic region: Secondary | ICD-10-CM | POA: Diagnosis not present

## 2018-01-22 DIAGNOSIS — M531 Cervicobrachial syndrome: Secondary | ICD-10-CM | POA: Diagnosis not present

## 2018-01-22 DIAGNOSIS — M9902 Segmental and somatic dysfunction of thoracic region: Secondary | ICD-10-CM | POA: Diagnosis not present

## 2018-01-22 DIAGNOSIS — M9901 Segmental and somatic dysfunction of cervical region: Secondary | ICD-10-CM | POA: Diagnosis not present

## 2018-01-22 DIAGNOSIS — M9905 Segmental and somatic dysfunction of pelvic region: Secondary | ICD-10-CM | POA: Diagnosis not present

## 2018-01-29 ENCOUNTER — Encounter: Payer: Self-pay | Admitting: Family Medicine

## 2018-01-29 ENCOUNTER — Ambulatory Visit (INDEPENDENT_AMBULATORY_CARE_PROVIDER_SITE_OTHER): Payer: Medicare Other | Admitting: Family Medicine

## 2018-01-29 VITALS — BP 135/74 | HR 57 | Ht 76.0 in | Wt 219.0 lb

## 2018-01-29 DIAGNOSIS — R7301 Impaired fasting glucose: Secondary | ICD-10-CM | POA: Diagnosis not present

## 2018-01-29 DIAGNOSIS — M5030 Other cervical disc degeneration, unspecified cervical region: Secondary | ICD-10-CM

## 2018-01-29 DIAGNOSIS — I10 Essential (primary) hypertension: Secondary | ICD-10-CM | POA: Diagnosis not present

## 2018-01-29 DIAGNOSIS — M531 Cervicobrachial syndrome: Secondary | ICD-10-CM | POA: Diagnosis not present

## 2018-01-29 DIAGNOSIS — M9901 Segmental and somatic dysfunction of cervical region: Secondary | ICD-10-CM | POA: Diagnosis not present

## 2018-01-29 DIAGNOSIS — M9905 Segmental and somatic dysfunction of pelvic region: Secondary | ICD-10-CM | POA: Diagnosis not present

## 2018-01-29 DIAGNOSIS — M4003 Postural kyphosis, cervicothoracic region: Secondary | ICD-10-CM | POA: Diagnosis not present

## 2018-01-29 DIAGNOSIS — E875 Hyperkalemia: Secondary | ICD-10-CM | POA: Diagnosis not present

## 2018-01-29 DIAGNOSIS — M9902 Segmental and somatic dysfunction of thoracic region: Secondary | ICD-10-CM | POA: Diagnosis not present

## 2018-01-29 LAB — POCT GLYCOSYLATED HEMOGLOBIN (HGB A1C): Hemoglobin A1C: 5.9

## 2018-01-29 NOTE — Progress Notes (Signed)
Subjective:    CC: BP  HPI:  Hypertension- Pt denies chest pain, SOB, dizziness, or heart palpitations.  Taking meds as directed w/o problems.  Denies medication side effects.    Impaired fasting glucose-no increased thirst or urination. No symptoms consistent with hypoglycemia.  He did have some mild hyperkalemia on last set of labs.  He is due to repeat those.  He has significant OA and a herniated disc in his neck. Has been seeing a chiropractor, Dr. Romilda Garret, Weekly and that has been helping.  Is also been taking glucosamine/chondroitin and hyaluronic acid.  Past medical history, Surgical history, Family history not pertinant except as noted below, Social history, Allergies, and medications have been entered into the medical record, reviewed, and corrections made.   Review of Systems: No fevers, chills, night sweats, weight loss, chest pain, or shortness of breath.   Objective:    General: Well Developed, well nourished, and in no acute distress.  Neuro: Alert and oriented x3, extra-ocular muscles intact, sensation grossly intact.  HEENT: Normocephalic, atraumatic  Skin: Warm and dry, no rashes. Cardiac: Regular rate and rhythm, no murmurs rubs or gallops, no lower extremity edema.  Respiratory: Clear to auscultation bilaterally. Not using accessory muscles, speaking in full sentences.   Impression and Recommendations:    HTN -well controlled.  Continue current regimen.  IFG -A1c up at 5.9 today.  We discussed making sure that he is eating low-carb and low sugar diet.  He admits he has been getting into more carbs over the last couple months disease been stress eating a little bit more.  He says it does go to when he gets stressed out.  He still active and has actually been going to the fitness center 2-3 times per week.  Cervical pain-continue chiropractic care with Dr. Romilda Garret.  It seems to be helping.  Also recommend a trial of turmeric.  Hyperkalemia-due to recheck  potassium.  Due for repeat colonoscopy

## 2018-01-31 ENCOUNTER — Other Ambulatory Visit: Payer: Self-pay | Admitting: Family Medicine

## 2018-01-31 DIAGNOSIS — E875 Hyperkalemia: Secondary | ICD-10-CM | POA: Diagnosis not present

## 2018-01-31 LAB — BASIC METABOLIC PANEL WITH GFR
BUN: 21 mg/dL (ref 7–25)
CHLORIDE: 105 mmol/L (ref 98–110)
CO2: 27 mmol/L (ref 20–32)
Calcium: 9.2 mg/dL (ref 8.6–10.3)
Creat: 1.04 mg/dL (ref 0.70–1.25)
GFR, EST AFRICAN AMERICAN: 87 mL/min/{1.73_m2} (ref >=60)
GFR, Est Non African American: 75 mL/min/{1.73_m2} (ref >=60)
GLUCOSE: 109 mg/dL — AB (ref 65–99)
POTASSIUM: 4.6 mmol/L (ref 3.5–5.3)
SODIUM: 138 mmol/L (ref 135–146)

## 2018-02-03 NOTE — Progress Notes (Signed)
All labs are normal. 

## 2018-02-04 DIAGNOSIS — M4003 Postural kyphosis, cervicothoracic region: Secondary | ICD-10-CM | POA: Diagnosis not present

## 2018-02-04 DIAGNOSIS — M9905 Segmental and somatic dysfunction of pelvic region: Secondary | ICD-10-CM | POA: Diagnosis not present

## 2018-02-04 DIAGNOSIS — M9901 Segmental and somatic dysfunction of cervical region: Secondary | ICD-10-CM | POA: Diagnosis not present

## 2018-02-04 DIAGNOSIS — M9902 Segmental and somatic dysfunction of thoracic region: Secondary | ICD-10-CM | POA: Diagnosis not present

## 2018-02-04 DIAGNOSIS — M531 Cervicobrachial syndrome: Secondary | ICD-10-CM | POA: Diagnosis not present

## 2018-02-06 DIAGNOSIS — M4003 Postural kyphosis, cervicothoracic region: Secondary | ICD-10-CM | POA: Diagnosis not present

## 2018-02-06 DIAGNOSIS — M9905 Segmental and somatic dysfunction of pelvic region: Secondary | ICD-10-CM | POA: Diagnosis not present

## 2018-02-06 DIAGNOSIS — M531 Cervicobrachial syndrome: Secondary | ICD-10-CM | POA: Diagnosis not present

## 2018-02-06 DIAGNOSIS — M9901 Segmental and somatic dysfunction of cervical region: Secondary | ICD-10-CM | POA: Diagnosis not present

## 2018-02-06 DIAGNOSIS — M9902 Segmental and somatic dysfunction of thoracic region: Secondary | ICD-10-CM | POA: Diagnosis not present

## 2018-02-11 DIAGNOSIS — M4003 Postural kyphosis, cervicothoracic region: Secondary | ICD-10-CM | POA: Diagnosis not present

## 2018-02-11 DIAGNOSIS — M531 Cervicobrachial syndrome: Secondary | ICD-10-CM | POA: Diagnosis not present

## 2018-02-11 DIAGNOSIS — M9902 Segmental and somatic dysfunction of thoracic region: Secondary | ICD-10-CM | POA: Diagnosis not present

## 2018-02-11 DIAGNOSIS — M9901 Segmental and somatic dysfunction of cervical region: Secondary | ICD-10-CM | POA: Diagnosis not present

## 2018-02-11 DIAGNOSIS — M9905 Segmental and somatic dysfunction of pelvic region: Secondary | ICD-10-CM | POA: Diagnosis not present

## 2018-02-18 ENCOUNTER — Other Ambulatory Visit: Payer: Self-pay | Admitting: Family Medicine

## 2018-02-18 DIAGNOSIS — M9902 Segmental and somatic dysfunction of thoracic region: Secondary | ICD-10-CM | POA: Diagnosis not present

## 2018-02-18 DIAGNOSIS — M531 Cervicobrachial syndrome: Secondary | ICD-10-CM | POA: Diagnosis not present

## 2018-02-18 DIAGNOSIS — M9905 Segmental and somatic dysfunction of pelvic region: Secondary | ICD-10-CM | POA: Diagnosis not present

## 2018-02-18 DIAGNOSIS — M9901 Segmental and somatic dysfunction of cervical region: Secondary | ICD-10-CM | POA: Diagnosis not present

## 2018-02-18 DIAGNOSIS — M4003 Postural kyphosis, cervicothoracic region: Secondary | ICD-10-CM | POA: Diagnosis not present

## 2018-02-25 DIAGNOSIS — M531 Cervicobrachial syndrome: Secondary | ICD-10-CM | POA: Diagnosis not present

## 2018-02-25 DIAGNOSIS — M4003 Postural kyphosis, cervicothoracic region: Secondary | ICD-10-CM | POA: Diagnosis not present

## 2018-02-25 DIAGNOSIS — M9902 Segmental and somatic dysfunction of thoracic region: Secondary | ICD-10-CM | POA: Diagnosis not present

## 2018-02-25 DIAGNOSIS — M9901 Segmental and somatic dysfunction of cervical region: Secondary | ICD-10-CM | POA: Diagnosis not present

## 2018-02-25 DIAGNOSIS — M9905 Segmental and somatic dysfunction of pelvic region: Secondary | ICD-10-CM | POA: Diagnosis not present

## 2018-03-11 DIAGNOSIS — M531 Cervicobrachial syndrome: Secondary | ICD-10-CM | POA: Diagnosis not present

## 2018-03-11 DIAGNOSIS — M9905 Segmental and somatic dysfunction of pelvic region: Secondary | ICD-10-CM | POA: Diagnosis not present

## 2018-03-11 DIAGNOSIS — M9902 Segmental and somatic dysfunction of thoracic region: Secondary | ICD-10-CM | POA: Diagnosis not present

## 2018-03-11 DIAGNOSIS — M4003 Postural kyphosis, cervicothoracic region: Secondary | ICD-10-CM | POA: Diagnosis not present

## 2018-03-11 DIAGNOSIS — M9901 Segmental and somatic dysfunction of cervical region: Secondary | ICD-10-CM | POA: Diagnosis not present

## 2018-03-18 DIAGNOSIS — M531 Cervicobrachial syndrome: Secondary | ICD-10-CM | POA: Diagnosis not present

## 2018-03-18 DIAGNOSIS — M9901 Segmental and somatic dysfunction of cervical region: Secondary | ICD-10-CM | POA: Diagnosis not present

## 2018-03-18 DIAGNOSIS — M9905 Segmental and somatic dysfunction of pelvic region: Secondary | ICD-10-CM | POA: Diagnosis not present

## 2018-03-18 DIAGNOSIS — M4003 Postural kyphosis, cervicothoracic region: Secondary | ICD-10-CM | POA: Diagnosis not present

## 2018-03-18 DIAGNOSIS — M9902 Segmental and somatic dysfunction of thoracic region: Secondary | ICD-10-CM | POA: Diagnosis not present

## 2018-03-25 DIAGNOSIS — M531 Cervicobrachial syndrome: Secondary | ICD-10-CM | POA: Diagnosis not present

## 2018-03-25 DIAGNOSIS — M9902 Segmental and somatic dysfunction of thoracic region: Secondary | ICD-10-CM | POA: Diagnosis not present

## 2018-03-25 DIAGNOSIS — M9907 Segmental and somatic dysfunction of upper extremity: Secondary | ICD-10-CM | POA: Diagnosis not present

## 2018-03-25 DIAGNOSIS — M25552 Pain in left hip: Secondary | ICD-10-CM | POA: Diagnosis not present

## 2018-04-01 DIAGNOSIS — M9902 Segmental and somatic dysfunction of thoracic region: Secondary | ICD-10-CM | POA: Diagnosis not present

## 2018-04-08 DIAGNOSIS — M25552 Pain in left hip: Secondary | ICD-10-CM | POA: Diagnosis not present

## 2018-04-08 DIAGNOSIS — S29011A Strain of muscle and tendon of front wall of thorax, initial encounter: Secondary | ICD-10-CM | POA: Diagnosis not present

## 2018-04-08 DIAGNOSIS — M531 Cervicobrachial syndrome: Secondary | ICD-10-CM | POA: Diagnosis not present

## 2018-04-08 DIAGNOSIS — M9902 Segmental and somatic dysfunction of thoracic region: Secondary | ICD-10-CM | POA: Diagnosis not present

## 2018-04-08 DIAGNOSIS — M9907 Segmental and somatic dysfunction of upper extremity: Secondary | ICD-10-CM | POA: Diagnosis not present

## 2018-04-08 DIAGNOSIS — S238XXA Sprain of other specified parts of thorax, initial encounter: Secondary | ICD-10-CM | POA: Diagnosis not present

## 2018-04-22 DIAGNOSIS — M25552 Pain in left hip: Secondary | ICD-10-CM | POA: Diagnosis not present

## 2018-04-22 DIAGNOSIS — M9907 Segmental and somatic dysfunction of upper extremity: Secondary | ICD-10-CM | POA: Diagnosis not present

## 2018-04-22 DIAGNOSIS — M9902 Segmental and somatic dysfunction of thoracic region: Secondary | ICD-10-CM | POA: Diagnosis not present

## 2018-04-22 DIAGNOSIS — M531 Cervicobrachial syndrome: Secondary | ICD-10-CM | POA: Diagnosis not present

## 2018-04-22 DIAGNOSIS — S238XXA Sprain of other specified parts of thorax, initial encounter: Secondary | ICD-10-CM | POA: Diagnosis not present

## 2018-04-22 DIAGNOSIS — S29011A Strain of muscle and tendon of front wall of thorax, initial encounter: Secondary | ICD-10-CM | POA: Diagnosis not present

## 2018-04-29 DIAGNOSIS — M9902 Segmental and somatic dysfunction of thoracic region: Secondary | ICD-10-CM | POA: Diagnosis not present

## 2018-04-29 DIAGNOSIS — M9907 Segmental and somatic dysfunction of upper extremity: Secondary | ICD-10-CM | POA: Diagnosis not present

## 2018-04-29 DIAGNOSIS — S238XXA Sprain of other specified parts of thorax, initial encounter: Secondary | ICD-10-CM | POA: Diagnosis not present

## 2018-04-29 DIAGNOSIS — M25552 Pain in left hip: Secondary | ICD-10-CM | POA: Diagnosis not present

## 2018-04-29 DIAGNOSIS — M531 Cervicobrachial syndrome: Secondary | ICD-10-CM | POA: Diagnosis not present

## 2018-04-29 DIAGNOSIS — S29011A Strain of muscle and tendon of front wall of thorax, initial encounter: Secondary | ICD-10-CM | POA: Diagnosis not present

## 2018-05-06 DIAGNOSIS — M9902 Segmental and somatic dysfunction of thoracic region: Secondary | ICD-10-CM | POA: Diagnosis not present

## 2018-05-06 DIAGNOSIS — M531 Cervicobrachial syndrome: Secondary | ICD-10-CM | POA: Diagnosis not present

## 2018-05-06 DIAGNOSIS — S238XXA Sprain of other specified parts of thorax, initial encounter: Secondary | ICD-10-CM | POA: Diagnosis not present

## 2018-05-27 DIAGNOSIS — M542 Cervicalgia: Secondary | ICD-10-CM | POA: Diagnosis not present

## 2018-05-27 DIAGNOSIS — M25552 Pain in left hip: Secondary | ICD-10-CM | POA: Diagnosis not present

## 2018-05-27 DIAGNOSIS — M9907 Segmental and somatic dysfunction of upper extremity: Secondary | ICD-10-CM | POA: Diagnosis not present

## 2018-05-27 DIAGNOSIS — M531 Cervicobrachial syndrome: Secondary | ICD-10-CM | POA: Diagnosis not present

## 2018-05-27 DIAGNOSIS — M9902 Segmental and somatic dysfunction of thoracic region: Secondary | ICD-10-CM | POA: Diagnosis not present

## 2018-06-17 DIAGNOSIS — M542 Cervicalgia: Secondary | ICD-10-CM | POA: Diagnosis not present

## 2018-06-17 DIAGNOSIS — M546 Pain in thoracic spine: Secondary | ICD-10-CM | POA: Diagnosis not present

## 2018-06-17 DIAGNOSIS — M9903 Segmental and somatic dysfunction of lumbar region: Secondary | ICD-10-CM | POA: Diagnosis not present

## 2018-06-17 DIAGNOSIS — M9902 Segmental and somatic dysfunction of thoracic region: Secondary | ICD-10-CM | POA: Diagnosis not present

## 2018-06-17 DIAGNOSIS — M545 Low back pain: Secondary | ICD-10-CM | POA: Diagnosis not present

## 2018-06-24 DIAGNOSIS — M9902 Segmental and somatic dysfunction of thoracic region: Secondary | ICD-10-CM | POA: Diagnosis not present

## 2018-06-24 DIAGNOSIS — M546 Pain in thoracic spine: Secondary | ICD-10-CM | POA: Diagnosis not present

## 2018-06-24 DIAGNOSIS — M542 Cervicalgia: Secondary | ICD-10-CM | POA: Diagnosis not present

## 2018-07-01 DIAGNOSIS — M9903 Segmental and somatic dysfunction of lumbar region: Secondary | ICD-10-CM | POA: Diagnosis not present

## 2018-07-01 DIAGNOSIS — M9902 Segmental and somatic dysfunction of thoracic region: Secondary | ICD-10-CM | POA: Diagnosis not present

## 2018-07-01 DIAGNOSIS — M546 Pain in thoracic spine: Secondary | ICD-10-CM | POA: Diagnosis not present

## 2018-07-01 DIAGNOSIS — M545 Low back pain: Secondary | ICD-10-CM | POA: Diagnosis not present

## 2018-07-01 DIAGNOSIS — M542 Cervicalgia: Secondary | ICD-10-CM | POA: Diagnosis not present

## 2018-07-08 DIAGNOSIS — M9903 Segmental and somatic dysfunction of lumbar region: Secondary | ICD-10-CM | POA: Diagnosis not present

## 2018-07-08 DIAGNOSIS — M546 Pain in thoracic spine: Secondary | ICD-10-CM | POA: Diagnosis not present

## 2018-07-08 DIAGNOSIS — M545 Low back pain: Secondary | ICD-10-CM | POA: Diagnosis not present

## 2018-07-08 DIAGNOSIS — M542 Cervicalgia: Secondary | ICD-10-CM | POA: Diagnosis not present

## 2018-07-08 DIAGNOSIS — M9902 Segmental and somatic dysfunction of thoracic region: Secondary | ICD-10-CM | POA: Diagnosis not present

## 2018-07-15 DIAGNOSIS — M546 Pain in thoracic spine: Secondary | ICD-10-CM | POA: Diagnosis not present

## 2018-07-15 DIAGNOSIS — M9903 Segmental and somatic dysfunction of lumbar region: Secondary | ICD-10-CM | POA: Diagnosis not present

## 2018-07-15 DIAGNOSIS — M545 Low back pain: Secondary | ICD-10-CM | POA: Diagnosis not present

## 2018-07-15 DIAGNOSIS — M542 Cervicalgia: Secondary | ICD-10-CM | POA: Diagnosis not present

## 2018-07-15 DIAGNOSIS — M9902 Segmental and somatic dysfunction of thoracic region: Secondary | ICD-10-CM | POA: Diagnosis not present

## 2018-07-29 DIAGNOSIS — M9902 Segmental and somatic dysfunction of thoracic region: Secondary | ICD-10-CM | POA: Diagnosis not present

## 2018-07-29 DIAGNOSIS — M542 Cervicalgia: Secondary | ICD-10-CM | POA: Diagnosis not present

## 2018-07-29 DIAGNOSIS — M9903 Segmental and somatic dysfunction of lumbar region: Secondary | ICD-10-CM | POA: Diagnosis not present

## 2018-07-30 ENCOUNTER — Ambulatory Visit (INDEPENDENT_AMBULATORY_CARE_PROVIDER_SITE_OTHER): Payer: Medicare Other | Admitting: Family Medicine

## 2018-07-30 ENCOUNTER — Encounter: Payer: Self-pay | Admitting: Family Medicine

## 2018-07-30 VITALS — BP 124/67 | HR 56 | Ht 76.0 in | Wt 222.0 lb

## 2018-07-30 DIAGNOSIS — Z23 Encounter for immunization: Secondary | ICD-10-CM

## 2018-07-30 DIAGNOSIS — R7301 Impaired fasting glucose: Secondary | ICD-10-CM | POA: Insufficient documentation

## 2018-07-30 DIAGNOSIS — I10 Essential (primary) hypertension: Secondary | ICD-10-CM | POA: Diagnosis not present

## 2018-07-30 DIAGNOSIS — Z125 Encounter for screening for malignant neoplasm of prostate: Secondary | ICD-10-CM

## 2018-07-30 LAB — POCT GLYCOSYLATED HEMOGLOBIN (HGB A1C): Hemoglobin A1C: 5.7 % — AB (ref 4.0–5.6)

## 2018-07-30 MED ORDER — AMLODIPINE BESYLATE 5 MG PO TABS: 5.0000 mg | ORAL_TABLET | Freq: Every day | ORAL | 3 refills | Status: DC

## 2018-07-30 NOTE — Patient Instructions (Signed)
Schedule your welcome to Medicare exam with our new wellness coach, Maudie Mercury.

## 2018-07-30 NOTE — Progress Notes (Signed)
Subjective:    Patient ID: Glenn Meyer, male    DOB: Mar 17, 1951, 67 y.o.   MRN: [id number]  HPI  Hypertension- Pt denies chest pain, SOB, dizziness, or heart palpitations.  Taking meds as directed w/o problems.  Denies medication side effects.    Impaired fasting glucose-no increased thirst or urination. No symptoms consistent with hypoglycemia.  He has been taking some glucosamine chondroitin for arthritis and has been seeing his chiropractor regularly for adjustments to his neck.   Review of Systems  BP 124/67   Pulse (!) 56   Ht 6\' 4"  (1.93 m)   Wt 222 lb (100.7 kg)   SpO2 100%   BMI 27.02 kg/m     Allergies  Allergen Reactions  . Lisinopril-Hydrochlorothiazide     REACTION: Cough  . Meperidine Hcl     REACTION: nausea    Past Medical History:  Diagnosis Date  . Hyperlipidemia   . Hypertension   . Ringing of ears    chronic X 20 years  . Tinnitus   . TMJ (dislocation of temporomandibular joint)     Past Surgical History:  Procedure Laterality Date  . TONSILECTOMY, ADENOIDECTOMY, BILATERAL MYRINGOTOMY AND TUBES    . TONSILLECTOMY     age 61    Social History   Socioeconomic History  . Marital status: Married    Spouse name: Not on file  . Number of children: Not on file  . Years of education: Not on file  . Highest education level: Not on file  Occupational History  . Not on file  Social Needs  . Financial resource strain: Not on file  . Food insecurity:    Worry: Not on file    Inability: Not on file  . Transportation needs:    Medical: Not on file    Non-medical: Not on file  Tobacco Use  . Smoking status: Former Smoker    Years: 3.00    Types: Cigarettes    Last attempt to quit: 10/22/1972    Years since quitting: 45.8  . Smokeless tobacco: Never Used  . Tobacco comment: quit 34 years ago  Substance and Sexual Activity  . Alcohol use: Yes    Alcohol/week: 7.0 standard drinks    Types: 7 Standard drinks or equivalent per week     Comment: per week  . Drug use: No  . 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 Physical activity:    Days per week: Not on file    Minutes per session: Not on file  . Stress: Not on file  Relationships  . Social connections:    Talks on phone: Not on file    Gets together: Not on file    Attends religious service: Not on file    Active member of club or organization: Not on file    Attends meetings of clubs or organizations: Not on file    Relationship status: Not on file  . Intimate partner violence:    Fear of current or ex partner: Not on file    Emotionally abused: Not on file    Physically abused: Not on file    Forced sexual activity: Not on file  Other Topics Concern  . Not on file  Social History Narrative  . Not on file    Family History  Problem Relation Age of Onset  . Hypertension Mother   . Hyperlipidemia  Mother   . Kidney disease Mother        congenital/diaylisis 45's  . Heart disease Father        bypass 2000  . Hypertension Unknown   . Hyperlipidemia Unknown     Outpatient Encounter Medications as of 07/30/2018  Medication Sig  . AMBULATORY NON FORMULARY MEDICATION Take by mouth daily. Medication Name: SUPER BETA PROSTATE  . amLODipine (NORVASC) 5 MG tablet Take 1 tablet (5 mg total) by mouth daily.  Marland Kitchen GLUCOSAMINE SULFATE PO Take 1,000 mg by mouth daily.  . Multiple Vitamin (MULTIVITAMIN) capsule Take 1 capsule by mouth daily.    . Omega-3 Fatty Acids (FISH OIL) 1000 MG CAPS Take by mouth.    . sildenafil (REVATIO) 20 MG tablet TAKE 2-5 TABLETS AS NEEDED 1-2 HOURS PRIOR  . [DISCONTINUED] AMBULATORY NON FORMULARY MEDICATION Medication Name: glucosomin/chondritin/hyaluronic acid  . [DISCONTINUED] amLODipine (NORVASC) 5 MG tablet TAKE 1 TABLET DAILY   No facility-administered encounter medications on file as of 07/30/2018.          Objective:   Physical Exam   Constitutional: He is oriented to person, place, and time. He appears well-developed and well-nourished.  HENT:  Head: Normocephalic and atraumatic.  Cardiovascular: Normal rate, regular rhythm and normal heart sounds.  Pulmonary/Chest: Effort normal and breath sounds normal.  Neurological: He is alert and oriented to person, place, and time.  Skin: Skin is warm and dry.  Psychiatric: He has a normal mood and affect. His behavior is normal.        Assessment & Plan:  HTN - Well controlled. Continue current regimen. Follow up in  60months.    IFG -we will controlled in fact his hemoglobin A1c is down to 5.7 today which looks great.  Follow-up in 6 months.  Given flu and Pneumova 23 vaccine today.    He says he is actually can probably be to retiring in January from [employer].  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

## 2018-08-05 DIAGNOSIS — M9903 Segmental and somatic dysfunction of lumbar region: Secondary | ICD-10-CM | POA: Diagnosis not present

## 2018-08-05 DIAGNOSIS — M546 Pain in thoracic spine: Secondary | ICD-10-CM | POA: Diagnosis not present

## 2018-08-05 DIAGNOSIS — M542 Cervicalgia: Secondary | ICD-10-CM | POA: Diagnosis not present

## 2018-08-05 DIAGNOSIS — M545 Low back pain: Secondary | ICD-10-CM | POA: Diagnosis not present

## 2018-08-05 DIAGNOSIS — M9902 Segmental and somatic dysfunction of thoracic region: Secondary | ICD-10-CM | POA: Diagnosis not present

## 2018-08-12 DIAGNOSIS — M546 Pain in thoracic spine: Secondary | ICD-10-CM | POA: Diagnosis not present

## 2018-08-12 DIAGNOSIS — M9903 Segmental and somatic dysfunction of lumbar region: Secondary | ICD-10-CM | POA: Diagnosis not present

## 2018-08-12 DIAGNOSIS — M9902 Segmental and somatic dysfunction of thoracic region: Secondary | ICD-10-CM | POA: Diagnosis not present

## 2018-08-12 DIAGNOSIS — M542 Cervicalgia: Secondary | ICD-10-CM | POA: Diagnosis not present

## 2018-08-19 DIAGNOSIS — M545 Low back pain: Secondary | ICD-10-CM | POA: Diagnosis not present

## 2018-08-19 DIAGNOSIS — M546 Pain in thoracic spine: Secondary | ICD-10-CM | POA: Diagnosis not present

## 2018-08-19 DIAGNOSIS — M542 Cervicalgia: Secondary | ICD-10-CM | POA: Diagnosis not present

## 2018-08-19 DIAGNOSIS — M9903 Segmental and somatic dysfunction of lumbar region: Secondary | ICD-10-CM | POA: Diagnosis not present

## 2018-08-19 DIAGNOSIS — M9902 Segmental and somatic dysfunction of thoracic region: Secondary | ICD-10-CM | POA: Diagnosis not present

## 2018-08-26 DIAGNOSIS — M9902 Segmental and somatic dysfunction of thoracic region: Secondary | ICD-10-CM | POA: Diagnosis not present

## 2018-08-26 DIAGNOSIS — M542 Cervicalgia: Secondary | ICD-10-CM | POA: Diagnosis not present

## 2018-08-26 DIAGNOSIS — M545 Low back pain: Secondary | ICD-10-CM | POA: Diagnosis not present

## 2018-08-26 DIAGNOSIS — M546 Pain in thoracic spine: Secondary | ICD-10-CM | POA: Diagnosis not present

## 2018-08-26 DIAGNOSIS — M9903 Segmental and somatic dysfunction of lumbar region: Secondary | ICD-10-CM | POA: Diagnosis not present

## 2018-08-27 NOTE — Progress Notes (Deleted)
Subjective:   Glenn Meyer is a 67 y.o. male who presents for an Initial Medicare Annual Wellness Visit.  Review of Systems  No ROS.  Medicare Wellness Visit. Additional risk factors are reflected in the social history.     Sleep patterns:    Home Safety/Smoke Alarms: Feels safe in home. Smoke alarms in place.  Living environment;    Male:   CC[facility]-     PSA- utd Lab Results  Component Value Date   PSA 1.7 08/30/2017   PSA 1.4 07/02/2016   PSA 1.04 03/01/2009       Objective:    There were no vitals filed for this visit. There is no height or weight on file to calculate BMI.  No flowsheet data found.  Current Medications (verified) Outpatient Encounter Medications as of 09/03/2018  Medication Sig  . AMBULATORY NON FORMULARY MEDICATION Take by mouth daily. Medication Name: SUPER BETA PROSTATE  . amLODipine (NORVASC) 5 MG tablet Take 1 tablet (5 mg total) by mouth daily.  Marland Kitchen GLUCOSAMINE SULFATE PO Take 1,000 mg by mouth daily.  . Multiple Vitamin (MULTIVITAMIN) capsule Take 1 capsule by mouth daily.    . Omega-3 Fatty Acids (FISH OIL) 1000 MG CAPS Take by mouth.    . sildenafil (REVATIO) 20 MG tablet TAKE 2-5 TABLETS AS NEEDED 1-2 HOURS PRIOR   No facility-administered encounter medications on file as of 09/03/2018.     Allergies (verified) Lisinopril-hydrochlorothiazide and Meperidine hcl   History: Past Medical History:  Diagnosis Date  . Hyperlipidemia   . Hypertension   . Ringing of ears    chronic X 20 years  . Tinnitus   . TMJ (dislocation of temporomandibular joint)    Past Surgical History:  Procedure Laterality Date  . TONSILECTOMY, ADENOIDECTOMY, BILATERAL MYRINGOTOMY AND TUBES    . TONSILLECTOMY     age 32   Family History  Problem Relation Age of Onset  . Hypertension Mother   . Hyperlipidemia Mother   . Kidney disease Mother        congenital/diaylisis 84's  . Heart disease Father        bypass 2000  . Hypertension Unknown   .  Hyperlipidemia Unknown    Social History   Socioeconomic History  . Marital status: Married    Spouse name: Not on file  . Number of children: Not on file  . Years of education: Not on file  . Highest education level: Not on file  Occupational History  . Not on file  Social Needs  . Financial resource strain: Not on file  . Food insecurity:    Worry: Not on file    Inability: Not on file  . Transportation needs:    Medical: Not on file    Non-medical: Not on file  Tobacco Use  . Smoking status: Former Smoker    Years: 3.00    Types: Cigarettes    Last attempt to quit: 10/22/1972    Years since quitting: 45.8  . Smokeless tobacco: Never Used  . Tobacco comment: quit 34 years ago  Substance and Sexual Activity  . Alcohol use: Yes    Alcohol/week: 7.0 standard drinks    Types: 7 Standard drinks or equivalent per week    Comment: per week  . Drug use: No  . 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 Physical activity:    Days per week:  Not on file    Minutes per session: Not on file  . Stress: Not on file  Relationships  . Social connections:    Talks on phone: Not on file    Gets together: Not on file    Attends religious service: Not on file    Active member of club or organization: Not on file    Attends meetings of clubs or organizations: Not on file    Relationship status: Not on file  Other Topics Concern  . Not on file  Social History Narrative  . Not on file   Tobacco Counseling Counseling given: Not Answered Comment: quit 34 years ago   Clinical Intake:                       Activities of Daily Living No flowsheet data found.   Immunizations and Health Maintenance Immunization History  Administered Date(s) Administered  . Influenza Split 08/20/2012  . Influenza Whole 09/26/2011  . Influenza, High Dose Seasonal PF 07/30/2018  . Influenza, Seasonal,  Injecte, Preservative Fre 09/02/2013  . Influenza,inj,Quad PF,6+ Mos 08/06/2014, 07/29/2015, 07/31/2017  . Influenza-Unspecified 07/22/2013, 08/16/2016  . Pneumococcal Polysaccharide-23 07/30/2018  . Td 10/23/2003  . Tdap 02/15/2016  . Zoster 10/08/2012   Health Maintenance Due  Topic Date Due  . COLONOSCOPY  08/30/2017    Patient Care Team: Glenn Marry, MD as PCP - General  Indicate any recent Medical Services you may have received from other than [facility] providers in the past year (date may be approximate).    Assessment:   This is a routine wellness examination for Glenn Meyer.Physical assessment deferred to PCP.   Hearing/Vision screen No exam data present  Dietary issues and exercise activities discussed:   Diet  Breakfast: Lunch:  Dinner:       Goals   None    Depression Screen PHQ 2/9 Scores 01/29/2018  PHQ - 2 Score 0    Fall Risk Fall Risk  01/29/2018  Falls in the past year? No    Is the patient's home free of loose throw rugs in walkways, pet beds, electrical cords, etc?   {Blank single:19197::"yes","no"}      Grab bars in the bathroom? {Blank single:19197::"yes","no"}      Handrails on the stairs?   {Blank single:19197::"yes","no"}      Adequate lighting?   {Blank single:19197::"yes","no"}  Cognitive Function:        Screening Tests Health Maintenance  Topic Date Due  . COLONOSCOPY  08/30/2017  . PNA vac Low Risk Adult (2 of 2 - PCV13) 07/31/2019  . TETANUS/TDAP  02/14/2026  . INFLUENZA VACCINE  Completed  . Hepatitis C Screening  Completed  . HIV Screening  Completed       Plan:   ***  I have personally reviewed and noted the following in the patient's chart:   . Medical and social history . Use of alcohol, tobacco or illicit drugs  . Current medications and supplements . Functional ability and status . Nutritional status . Physical activity . Advanced directives . List of other physicians . Hospitalizations, surgeries, and ER  visits in previous 12 months . Vitals . Screenings to include cognitive, depression, and falls . Referrals and appointments  In addition, I have reviewed and discussed with patient certain preventive protocols, quality metrics, and best practice recommendations. A written personalized care plan for preventive services as well as general preventive health recommendations were provided to patient.     Janalyn Harder  Araceli Bouche, LPN   [id number]/01/[id number]

## 2018-09-03 ENCOUNTER — Ambulatory Visit: Payer: Medicare Other

## 2018-09-06 ENCOUNTER — Emergency Department
Admission: EM | Admit: 2018-09-06 | Discharge: 2018-09-06 | Disposition: A | Discharge status: Home / Self Care | Payer: Medicare Other | Source: Home / Self Care | Attending: Family Medicine | Admitting: Family Medicine

## 2018-09-06 ENCOUNTER — Other Ambulatory Visit: Payer: Self-pay

## 2018-09-06 DIAGNOSIS — J069 Acute upper respiratory infection, unspecified: Secondary | ICD-10-CM | POA: Diagnosis not present

## 2018-09-06 MED ORDER — AZITHROMYCIN 250 MG PO TABS: 250.0000 mg | ORAL_TABLET | Freq: Every day | ORAL | 0 refills | Status: DC

## 2018-09-06 MED ORDER — BENZONATATE 100 MG PO CAPS: 100.0000 mg | ORAL_CAPSULE | Freq: Three times a day (TID) | ORAL | 0 refills | Status: DC

## 2018-09-06 NOTE — ED Provider Notes (Signed)
[facility]    CSN: [id number] Arrival date & time: 09/06/18  1209     History   Chief Complaint Chief Complaint  Patient presents with  . Cough  . Nasal Congestion  . Fatigue    HPI Glenn Meyer is a 67 y.o. male.   HPI  Glenn Meyer is a 67 y.o. male presenting to UC with c/o 4-5 days of worsening nonproductive hacking cough with associated fatigue and nasal congestion. Cough wakes him up at night.  He has taken Robitussin DM and Theraflu w/o improvement.  He has been vaccinated for the flu and pneumonia this year.  Denies fever, chills, n/v/d. Others at work have been sick.    Past Medical History:  Diagnosis Date  . Hyperlipidemia   . Hypertension   . Ringing of ears    chronic X 20 years  . Tinnitus   . TMJ (dislocation of temporomandibular joint)     Patient Active Problem List   Diagnosis Date Noted  . IFG (impaired fasting glucose) 07/30/2018  . Degenerative arthritis of left hip 10/03/2012  . SHOULDER PAIN, RIGHT 03/17/2008  . Hyperlipidemia 08/08/2007  . HYPERTENSION, BENIGN ESSENTIAL 07/30/2007  . GERD 07/30/2007    Past Surgical History:  Procedure Laterality Date  . TONSILECTOMY, ADENOIDECTOMY, BILATERAL MYRINGOTOMY AND TUBES    . TONSILLECTOMY     age 48       Home Medications    Prior to Admission medications   Medication Sig Start Date End Date Taking? Authorizing Provider  AMBULATORY NON FORMULARY MEDICATION Take by mouth daily. Medication Name: [place]    [provider]  amLODipine (NORVASC) 5 MG tablet Take 1 tablet (5 mg total) by mouth daily. 07/30/18   Hali Marry, MD  azithromycin (ZITHROMAX) 250 MG tablet Take 1 tablet (250 mg total) by mouth daily. Take first 2 tablets together, then 1 every day until finished. 09/06/18   Noe Gens, PA-C  benzonatate (TESSALON) 100 MG capsule Take 1-2 capsules (100-200 mg total) by mouth every 8 (eight) hours. 09/06/18   Noe Gens, PA-C    GLUCOSAMINE SULFATE PO Take 1,000 mg by mouth daily.    [provider]  Multiple Vitamin (MULTIVITAMIN) capsule Take 1 capsule by mouth daily.      [provider]  Omega-3 Fatty Acids (FISH OIL) 1000 MG CAPS Take by mouth.      [provider]  sildenafil (REVATIO) 20 MG tablet TAKE 2-5 TABLETS AS NEEDED 1-2 HOURS PRIOR 01/31/18   Hali Marry, MD    Family History Family History  Problem Relation Age of Onset  . Hypertension Mother   . Hyperlipidemia Mother   . Kidney disease Mother        congenital/diaylisis 47's  . Heart disease Father        bypass 2000  . Hypertension Unknown   . Hyperlipidemia Unknown     Social History Social History   Tobacco Use  . Smoking status: Former Smoker    Years: 3.00    Types: Cigarettes    Last attempt to quit: 10/22/1972    Years since quitting: 45.9  . Smokeless tobacco: Never Used  . Tobacco comment: quit 34 years ago  Substance Use Topics  . Alcohol use: Yes    Alcohol/week: 7.0 standard drinks    Types: 7 Standard drinks or equivalent per week    Comment: per week  . Drug use: No  Allergies   Lisinopril-hydrochlorothiazide and Meperidine hcl   Review of Systems Review of Systems  Constitutional: Positive for fatigue. Negative for chills and fever.  HENT: Positive for congestion, postnasal drip and rhinorrhea. Negative for ear pain, sore throat, trouble swallowing and voice change.   Respiratory: Positive for cough. Negative for shortness of breath.   Cardiovascular: Negative for chest pain and palpitations.  Gastrointestinal: Negative for abdominal pain, diarrhea, nausea and vomiting.  Musculoskeletal: Negative for arthralgias, back pain and myalgias.  Skin: Negative for rash.     Physical Exam Triage Vital Signs ED Triage Vitals  Enc Vitals Group     BP 09/06/18 1239 114/79     Pulse Rate 09/06/18 1239 71     Resp 09/06/18 1239 18     Temp 09/06/18 1239 97.7 F (36.5 C)      Temp Source 09/06/18 1239 Oral     SpO2 09/06/18 1239 99 %     Weight 09/06/18 1240 199 lb (90.3 kg)     Height 09/06/18 1240 5\' 11"  (1.803 m)     Head Circumference --      Peak Flow --      Pain Score 09/06/18 1239 0     Pain Loc --      Pain Edu? --      Excl. in [place]? --    No data found.  Updated Vital Signs BP 114/79 (BP Location: Right Arm)   Pulse 71   Temp 97.7 F (36.5 C) (Oral)   Resp 18   Ht 5\' 11"  (1.803 m)   Wt 199 lb (90.3 kg)   SpO2 99%   BMI 27.75 kg/m   Visual Acuity Right Eye Distance:   Left Eye Distance:   Bilateral Distance:    Right Eye Near:   Left Eye Near:    Bilateral Near:     Physical Exam  Constitutional: He is oriented to person, place, and time. He appears well-developed and well-nourished. No distress.  HENT:  Head: Normocephalic and atraumatic.  Right Ear: Tympanic membrane normal.  Left Ear: Tympanic membrane normal.  Nose: Nose normal. Right sinus exhibits no maxillary sinus tenderness and no frontal sinus tenderness. Left sinus exhibits no maxillary sinus tenderness and no frontal sinus tenderness.  Mouth/Throat: Uvula is midline, oropharynx is clear and moist and mucous membranes are normal.  Eyes: EOM are normal.  Neck: Normal range of motion. Neck supple.  Cardiovascular: Normal rate and regular rhythm.  Pulmonary/Chest: Effort normal. No stridor. No respiratory distress. He has no wheezes. He has rhonchi ( faint, diffuse). He has no rales.  Musculoskeletal: Normal range of motion.  Neurological: He is alert and oriented to person, place, and time.  Skin: Skin is warm and dry. He is not diaphoretic.  Psychiatric: He has a normal mood and affect. His behavior is normal.  Nursing note and vitals reviewed.    UC Treatments / Results  Labs (all labs ordered are listed, but only abnormal results are displayed) Labs Reviewed - No data to display  EKG None  Radiology No results found.  Procedures Procedures (including  critical care time)  Medications Ordered in UC Medications - No data to display  Initial Impression / Assessment and Plan / UC Course  I have reviewed the triage vital signs and the nursing notes.  Pertinent labs & imaging results that were available during my care of the patient were reviewed by me and considered in my medical decision making (see chart for details).  Hx and exam c/w URI, will cover for atypical bacteria Home care instructions provided.  Final Clinical Impressions(s) / UC Diagnoses   Final diagnoses:  Upper respiratory tract infection, unspecified type     Discharge Instructions     Please take antibiotics as prescribed and be sure to complete entire course even if you start to feel better to ensure infection does not come back.  Please follow up with family medicine in 1 week if not improving.    ED Prescriptions    Medication Sig Dispense Auth. Provider   benzonatate (TESSALON) 100 MG capsule Take 1-2 capsules (100-200 mg total) by mouth every 8 (eight) hours. 21 capsule Gerarda Fraction, Ellena Kamen O, PA-C   azithromycin (ZITHROMAX) 250 MG tablet Take 1 tablet (250 mg total) by mouth daily. Take first 2 tablets together, then 1 every day until finished. 6 tablet Noe Gens, PA-C     Controlled Substance Prescriptions  Controlled Substance Registry consulted? Not Applicable   Noe Gens, PA-C 09/06/18 1520

## 2018-09-06 NOTE — ED Triage Notes (Signed)
Glenn Meyer presents with a non productive cough "hacking", fatigue, and nasal congestion for 4-5 days. He has taken Robitussin DM and Theraflu  otc without any improvement. Pt also reports being unable to sleep due to waking up secondary to coughing spells. VS wdl and he is afebrile. He has been vaccinated for both Influenza and Pneumonia.

## 2018-09-06 NOTE — Discharge Instructions (Signed)
Please take antibiotics as prescribed and be sure to complete entire course even if you start to feel better to ensure infection does not come back.  Please follow up with family medicine in 1 week if not improving.

## 2018-09-12 DIAGNOSIS — M542 Cervicalgia: Secondary | ICD-10-CM | POA: Diagnosis not present

## 2018-09-12 DIAGNOSIS — M9902 Segmental and somatic dysfunction of thoracic region: Secondary | ICD-10-CM | POA: Diagnosis not present

## 2018-09-12 DIAGNOSIS — M9903 Segmental and somatic dysfunction of lumbar region: Secondary | ICD-10-CM | POA: Diagnosis not present

## 2018-10-01 DIAGNOSIS — M542 Cervicalgia: Secondary | ICD-10-CM | POA: Diagnosis not present

## 2018-10-01 DIAGNOSIS — M545 Low back pain: Secondary | ICD-10-CM | POA: Diagnosis not present

## 2018-10-01 DIAGNOSIS — M546 Pain in thoracic spine: Secondary | ICD-10-CM | POA: Diagnosis not present

## 2018-10-01 DIAGNOSIS — M9903 Segmental and somatic dysfunction of lumbar region: Secondary | ICD-10-CM | POA: Diagnosis not present

## 2018-10-01 DIAGNOSIS — M9902 Segmental and somatic dysfunction of thoracic region: Secondary | ICD-10-CM | POA: Diagnosis not present

## 2018-10-02 ENCOUNTER — Ambulatory Visit (INDEPENDENT_AMBULATORY_CARE_PROVIDER_SITE_OTHER): Payer: Medicare Other

## 2018-10-02 ENCOUNTER — Ambulatory Visit (INDEPENDENT_AMBULATORY_CARE_PROVIDER_SITE_OTHER): Payer: Medicare Other | Admitting: Sports Medicine

## 2018-10-02 ENCOUNTER — Encounter: Payer: Self-pay | Admitting: Sports Medicine

## 2018-10-02 DIAGNOSIS — M76892 Other specified enthesopathies of left lower limb, excluding foot: Secondary | ICD-10-CM

## 2018-10-02 DIAGNOSIS — M1711 Unilateral primary osteoarthritis, right knee: Secondary | ICD-10-CM | POA: Diagnosis not present

## 2018-10-02 DIAGNOSIS — M1712 Unilateral primary osteoarthritis, left knee: Secondary | ICD-10-CM | POA: Diagnosis not present

## 2018-10-02 NOTE — Progress Notes (Signed)
Subjective:    CC: Right knee pain  HPI: This is a pleasant 67 year old male, for the past several weeks has had worsening pain in the anterior aspect of his right knee, worse with sitting, worse going up and down stairs, no trauma, no mechanical symptoms, has tried oral analgesics, NSAIDs without any improvement.  Pain is waking him from sleep.  I reviewed the past medical history, family history, social history, surgical history, and allergies today and no changes were needed.  Please see the problem list section below in epic for further details.  Past Medical History: Past Medical History:  Diagnosis Date  . Hyperlipidemia   . Hypertension   . Ringing of ears    chronic X 20 years  . Tinnitus   . TMJ (dislocation of temporomandibular joint)    Past Surgical History: Past Surgical History:  Procedure Laterality Date  . TONSILECTOMY, ADENOIDECTOMY, BILATERAL MYRINGOTOMY AND TUBES    . TONSILLECTOMY     age 47   Social History: Social History   Socioeconomic History  . Marital status: Married    Spouse name: Not on file  . Number of children: Not on file  . Years of education: Not on file  . Highest education level: Not on file  Occupational History  . Not on file  Social Needs  . Financial resource strain: Not on file  . Food insecurity:    Worry: Not on file    Inability: Not on file  . Transportation needs:    Medical: Not on file    Non-medical: Not on file  Tobacco Use  . Smoking status: Former Smoker    Years: 3.00    Types: Cigarettes    Last attempt to quit: 10/22/1972    Years since quitting: 45.9  . Smokeless tobacco: Never Used  . Tobacco comment: quit 34 years ago  Substance and Sexual Activity  . Alcohol use: Yes    Alcohol/week: 7.0 standard drinks    Types: 7 Standard drinks or equivalent per week    Comment: per week  . Drug use: No  . 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 Physical activity:    Days per week: Not on file    Minutes per session: Not on file  . Stress: Not on file  Relationships  . Social connections:    Talks on phone: Not on file    Gets together: Not on file    Attends religious service: Not on file    Active member of club or organization: Not on file    Attends meetings of clubs or organizations: Not on file    Relationship status: Not on file  Other Topics Concern  . Not on file  Social History Narrative  . Not on file   Family History: Family History  Problem Relation Age of Onset  . Hypertension Mother   . Hyperlipidemia Mother   . Kidney disease Mother        congenital/diaylisis 71's  . Heart disease Father        bypass 2000  . Hypertension Unknown   . Hyperlipidemia Unknown    Allergies: Allergies  Allergen Reactions  . Lisinopril-Hydrochlorothiazide     REACTION: Cough  . Meperidine Hcl     REACTION: nausea   Medications: See med rec.  Review of Systems: No fevers, chills, night sweats, weight loss, chest pain, or shortness of  breath.   Objective:    General: Well Developed, well nourished, and in no acute distress.  Neuro: Alert and oriented x3, extra-ocular muscles intact, sensation grossly intact.  HEENT: Normocephalic, atraumatic, pupils equal round reactive to light, neck supple, no masses, no lymphadenopathy, thyroid nonpalpable.  Skin: Warm and dry, no rashes. Cardiac: Regular rate and rhythm, no murmurs rubs or gallops, no lower extremity edema.  Respiratory: Clear to auscultation bilaterally. Not using accessory muscles, speaking in full sentences. Right knee: Normal to inspection with no erythema or effusion or obvious bony abnormalities. Tender to palpation under the lateral patellar facet ROM normal in flexion and extension and lower leg rotation. Ligaments with solid consistent endpoints including ACL, PCL, LCL, MCL. Negative Mcmurray's and provocative meniscal  tests. Moderately painful patellar compression. Patellar and quadriceps tendons unremarkable. Hamstring and quadriceps strength is normal.  Procedure: Real-time Ultrasound Guided Injection of right knee Device: GE Logiq E  Verbal informed consent obtained.  Time-out conducted.  Noted no overlying erythema, induration, or other signs of local infection.  Skin prepped in a sterile fashion.  Local anesthesia: Topical Ethyl chloride.  With sterile technique and under real time ultrasound guidance: 1 cc Kenalog 40, 2 cc lidocaine, 2 cc bupivacaine injected easily Completed without difficulty  Pain immediately resolved suggesting accurate placement of the medication.  Advised to call if fevers/chills, erythema, induration, drainage, or persistent bleeding.  Images permanently stored and available for review in the ultrasound unit.  Impression: Technically successful ultrasound guided injection.  Impression and Recommendations:    Patellofemoral arthritis of right knee Injection, rehab exercises given, x-rays. Return in 1 month. ___________________________________________ Gwen Her. Dianah Field, M.D., ABFM., CAQSM. Primary Care and Sports Medicine [facility]  Adjunct Professor of [employer]

## 2018-10-02 NOTE — Assessment & Plan Note (Signed)
Injection, rehab exercises given, x-rays. Return in 1 month.

## 2018-11-12 ENCOUNTER — Ambulatory Visit: Payer: Medicare Other | Admitting: Sports Medicine

## 2018-12-02 DIAGNOSIS — D045 Carcinoma in situ of skin of trunk: Secondary | ICD-10-CM | POA: Diagnosis not present

## 2018-12-02 DIAGNOSIS — D235 Other benign neoplasm of skin of trunk: Secondary | ICD-10-CM | POA: Diagnosis not present

## 2018-12-02 DIAGNOSIS — D0462 Carcinoma in situ of skin of left upper limb, including shoulder: Secondary | ICD-10-CM | POA: Diagnosis not present

## 2018-12-06 DIAGNOSIS — M545 Low back pain: Secondary | ICD-10-CM | POA: Diagnosis not present

## 2018-12-06 DIAGNOSIS — M9903 Segmental and somatic dysfunction of lumbar region: Secondary | ICD-10-CM | POA: Diagnosis not present

## 2018-12-06 DIAGNOSIS — M542 Cervicalgia: Secondary | ICD-10-CM | POA: Diagnosis not present

## 2018-12-06 DIAGNOSIS — M546 Pain in thoracic spine: Secondary | ICD-10-CM | POA: Diagnosis not present

## 2018-12-06 DIAGNOSIS — M9902 Segmental and somatic dysfunction of thoracic region: Secondary | ICD-10-CM | POA: Diagnosis not present

## 2018-12-08 DIAGNOSIS — M9902 Segmental and somatic dysfunction of thoracic region: Secondary | ICD-10-CM | POA: Diagnosis not present

## 2018-12-08 DIAGNOSIS — M542 Cervicalgia: Secondary | ICD-10-CM | POA: Diagnosis not present

## 2018-12-08 DIAGNOSIS — M545 Low back pain: Secondary | ICD-10-CM | POA: Diagnosis not present

## 2018-12-08 DIAGNOSIS — M546 Pain in thoracic spine: Secondary | ICD-10-CM | POA: Diagnosis not present

## 2018-12-08 DIAGNOSIS — M9903 Segmental and somatic dysfunction of lumbar region: Secondary | ICD-10-CM | POA: Diagnosis not present

## 2018-12-11 ENCOUNTER — Encounter: Payer: Self-pay | Admitting: Family Medicine

## 2018-12-11 ENCOUNTER — Ambulatory Visit (INDEPENDENT_AMBULATORY_CARE_PROVIDER_SITE_OTHER): Payer: Medicare Other | Admitting: Family Medicine

## 2018-12-11 VITALS — BP 136/63 | HR 65 | Ht 71.0 in | Wt 213.0 lb

## 2018-12-11 DIAGNOSIS — M71349 Other bursal cyst, unspecified hand: Secondary | ICD-10-CM | POA: Diagnosis not present

## 2018-12-11 DIAGNOSIS — R21 Rash and other nonspecific skin eruption: Secondary | ICD-10-CM

## 2018-12-11 DIAGNOSIS — L812 Freckles: Secondary | ICD-10-CM | POA: Diagnosis not present

## 2018-12-11 DIAGNOSIS — L821 Other seborrheic keratosis: Secondary | ICD-10-CM

## 2018-12-11 MED ORDER — HYDROQUINONE 2 % EX CREA: TOPICAL_CREAM | Freq: Two times a day (BID) | CUTANEOUS | 0 refills | Status: DC

## 2018-12-11 NOTE — Progress Notes (Signed)
Acute Office Visit  Subjective:    Patient ID: Glenn Meyer, male    DOB: 06-24-1951, 68 y.o.   MRN: [id number]  Chief Complaint  Patient presents with  . Rash    L lateral ankle he reports that this started sometime before christmas, he said that it has not really been itchy he has been using hydrocortisone cream  . Ganglion Cyst    on L ring finger he noticed this last wk, doesn't hurt unless pressed on    HPI Patient is in today for L lateral ankle rash he reports that this started sometime before christmas, he said that it has not really been itchy he has been using hydrocortisone cream.  He says he really does not scratch at it a lot and does not remember anything rubbing against it or causing irritation.  Cyst on L ring finger he noticed this last wk, doesn't hurt unless pressed on.  He does not remember any injury or trauma.  He said he has had some cyst removed off his back and neck before.  Wants to know how to "fade" the spots around his neck.  He did go see dermatology and they recommended laser therapy but he says it to be about $5000 and he cannot afford that he read online about some creams that can fade some of the pigmentation and freckles and wants to know if he could use something like that.  Past Medical History:  Diagnosis Date  . Hyperlipidemia   . Hypertension   . Ringing of ears    chronic X 20 years  . Tinnitus   . TMJ (dislocation of temporomandibular joint)     Past Surgical History:  Procedure Laterality Date  . TONSILECTOMY, ADENOIDECTOMY, BILATERAL MYRINGOTOMY AND TUBES    . TONSILLECTOMY     age 40    Family History  Problem Relation Age of Onset  . Hypertension Mother   . Hyperlipidemia Mother   . Kidney disease Mother        congenital/diaylisis 52's  . Heart disease Father        bypass 2000  . Hypertension Unknown   . Hyperlipidemia Unknown     Social History   Socioeconomic History  . Marital status: Married    Spouse name:  Not on file  . Number of children: Not on file  . Years of education: Not on file  . Highest education level: Not on file  Occupational History  . Not on file  Social Needs  . Financial resource strain: Not on file  . Food insecurity:    Worry: Not on file    Inability: Not on file  . Transportation needs:    Medical: Not on file    Non-medical: Not on file  Tobacco Use  . Smoking status: Former Smoker    Years: 3.00    Types: Cigarettes    Last attempt to quit: 10/22/1972    Years since quitting: 46.1  . Smokeless tobacco: Never Used  . Tobacco comment: quit 34 years ago  Substance and Sexual Activity  . Alcohol use: Yes    Alcohol/week: 7.0 standard drinks    Types: 7 Standard drinks or equivalent per week    Comment: per week  . Drug use: No  . 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 Physical activity:    Days per week: Not on file  Minutes per session: Not on file  . Stress: Not on file  Relationships  . Social connections:    Talks on phone: Not on file    Gets together: Not on file    Attends religious service: Not on file    Active member of club or organization: Not on file    Attends meetings of clubs or organizations: Not on file    Relationship status: Not on file  . Intimate partner violence:    Fear of current or ex partner: Not on file    Emotionally abused: Not on file    Physically abused: Not on file    Forced sexual activity: Not on file  Other Topics Concern  . Not on file  Social History Narrative  . Not on file    Outpatient Medications Prior to Visit  Medication Sig Dispense Refill  . AMBULATORY NON FORMULARY MEDICATION Take by mouth daily. Medication Name: SUPER BETA PROSTATE    . amLODipine (NORVASC) 5 MG tablet Take 1 tablet (5 mg total) by mouth daily. 90 tablet 3  . GLUCOSAMINE SULFATE PO Take 1,000 mg by mouth daily.    . Multiple Vitamin  (MULTIVITAMIN) capsule Take 1 capsule by mouth daily.      . Omega-3 Fatty Acids (FISH OIL) 1000 MG CAPS Take by mouth.      . sildenafil (REVATIO) 20 MG tablet TAKE 2-5 TABLETS AS NEEDED 1-2 HOURS PRIOR 50 tablet 11  . azithromycin (ZITHROMAX) 250 MG tablet Take 1 tablet (250 mg total) by mouth daily. Take first 2 tablets together, then 1 every day until finished. 6 tablet 0   No facility-administered medications prior to visit.     Allergies  Allergen Reactions  . Lisinopril-Hydrochlorothiazide     REACTION: Cough  . Meperidine Hcl     REACTION: nausea    ROS     Objective:    Physical Exam  Constitutional: He is oriented to person, place, and time. He appears well-developed and well-nourished.  HENT:  Head: Normocephalic and atraumatic.  Eyes: Conjunctivae and EOM are normal.  Cardiovascular: Normal rate.  Pulmonary/Chest: Effort normal.  Neurological: He is alert and oriented to person, place, and time.  Skin: Skin is dry. No pallor.  Over his left outer ankle he has a very thickened area of very dry scaling skin.  And is has more of a dusky appearance to it versus bright red or irritated.  No cracking or bleeding.  Psychiatric: He has a normal mood and affect. His behavior is normal.  Vitals reviewed.   BP 136/63   Pulse 65   Ht 5\' 11"  (1.803 m)   Wt 213 lb (96.6 kg)   SpO2 100%   BMI 29.71 kg/m  Wt Readings from Last 3 Encounters:  12/11/18 213 lb (96.6 kg)  10/02/18 204 lb (92.5 kg)  09/06/18 199 lb (90.3 kg)    Health Maintenance Due  Topic Date Due  . COLONOSCOPY  08/30/2017    There are no preventive care reminders to display for this patient.   Lab Results  Component Value Date   TSH 1.38 08/30/2017   Lab Results  Component Value Date   WBC 4.7 08/30/2017   HGB 15.5 08/30/2017   HCT 46.3 08/30/2017   MCV 85.7 08/30/2017   PLT 206 08/30/2017   Lab Results  Component Value Date   NA 138 01/31/2018   K 4.6 01/31/2018   CO2 27 01/31/2018    GLUCOSE 109 (H) 01/31/2018  BUN 21 01/31/2018   CREATININE 1.04 01/31/2018   BILITOT 0.5 08/30/2017   ALKPHOS 56 08/03/2015   AST 28 08/30/2017   ALT 28 08/30/2017   PROT 6.8 08/30/2017   ALBUMIN 4.1 08/03/2015   CALCIUM 9.2 01/31/2018   Lab Results  Component Value Date   CHOL 215 (H) 08/30/2017   Lab Results  Component Value Date   HDL 50 08/30/2017   Lab Results  Component Value Date   LDLCALC 144 (H) 08/30/2017   Lab Results  Component Value Date   TRIG 98 08/30/2017   Lab Results  Component Value Date   CHOLHDL 4.3 08/30/2017   Lab Results  Component Value Date   HGBA1C 5.7 (A) 07/30/2018       Assessment & Plan:   Problem List Items Addressed This Visit    None    Visit Diagnoses    Rash    -  Primary   Synovial cyst of hand       Freckles       Seborrheic keratoses         Left ankle rash-most consistent with eczema.  Recommend a trial of a stronger steroid cream for 2 weeks and if not improving then please give [place] a call back.  Synovial cyst of fourth finger on left hand-gave reassurance.  They can be drained but often recur.  And sometimes they can be surgically removed.  He says for now he will keep an eye on it and let me know if he feels like it is getting sore or getting larger.  As far as the pigmentation on his neck he has a lot of different types of skin lesions including freckling, solar lentigo, seborrheic keratoses and atypical nevi.  I did explain that something with a quinolone can sometimes fade the freckling but will not address the pigmentation of the other lesions.  He says he would still like to try it and so I sent a prescription to his pharmacy.  Meds ordered this encounter  Medications  . hydroquinone 2 % cream    Sig: Apply topically 2 (two) times daily.    Dispense:  70 g    Refill:  0     Beatrice Lecher, MD

## 2018-12-24 ENCOUNTER — Telehealth: Payer: Self-pay

## 2018-12-24 NOTE — Telephone Encounter (Signed)
Glenn Meyer called and states the pharmacy never received the prescription of the hydroquinone 2% cream.   I called the pharmacy and they did receive the prescription. However they can't get the hydroquinone 2% cream. They do have the hydroquinone 4%. Please advise.

## 2018-12-25 MED ORDER — HYDROQUINONE 4 % EX CREA: TOPICAL_CREAM | Freq: Two times a day (BID) | CUTANEOUS | 0 refills | Status: DC

## 2018-12-25 NOTE — Telephone Encounter (Signed)
New prescription sent. Patient is aware.

## 2018-12-25 NOTE — Telephone Encounter (Signed)
Okay to change to the 4%.  As far as I am aware we never received any notification from the pharmacy about the 2% cream being out of stock.

## 2019-01-06 ENCOUNTER — Ambulatory Visit: Payer: Medicare Other | Admitting: Sports Medicine

## 2019-01-26 ENCOUNTER — Telehealth: Payer: Self-pay

## 2019-01-26 MED ORDER — TADALAFIL 20 MG PO TABS: 10.0000 mg | ORAL_TABLET | ORAL | 11 refills | Status: DC | PRN

## 2019-01-26 NOTE — Telephone Encounter (Signed)
New rx sent [facility].

## 2019-01-26 NOTE — Telephone Encounter (Signed)
[facility] called and wanted to know if Dr Madilyn Fireman would switch him from generic Viagra to generic Cialis daily. Please advise.

## 2019-01-26 NOTE — Telephone Encounter (Signed)
Patient advised.

## 2019-01-28 ENCOUNTER — Ambulatory Visit: Payer: Medicare Other

## 2019-01-28 ENCOUNTER — Encounter: Payer: Self-pay | Admitting: Family Medicine

## 2019-01-28 ENCOUNTER — Telehealth (INDEPENDENT_AMBULATORY_CARE_PROVIDER_SITE_OTHER): Payer: Medicare Other | Admitting: Family Medicine

## 2019-01-28 VITALS — BP 132/80 | HR 62 | Temp 98.7°F | Ht 71.0 in | Wt 228.0 lb

## 2019-01-28 DIAGNOSIS — I10 Essential (primary) hypertension: Secondary | ICD-10-CM | POA: Diagnosis not present

## 2019-01-28 DIAGNOSIS — R7301 Impaired fasting glucose: Secondary | ICD-10-CM

## 2019-01-28 DIAGNOSIS — M542 Cervicalgia: Secondary | ICD-10-CM | POA: Diagnosis not present

## 2019-01-28 DIAGNOSIS — J302 Other seasonal allergic rhinitis: Secondary | ICD-10-CM

## 2019-01-28 DIAGNOSIS — Z125 Encounter for screening for malignant neoplasm of prostate: Secondary | ICD-10-CM | POA: Diagnosis not present

## 2019-01-28 NOTE — Progress Notes (Signed)
Virtual Visit via Video Note  I connected with Glenn Meyer on 01/28/19 at  8:50 AM EDT by a video enabled telemedicine application and verified that I am speaking with the correct person using two identifiers.   I discussed the limitations of evaluation and management by telemedicine and the availability of in person appointments. The patient expressed understanding and agreed to proceed.    Subjective:    CC: BP check   HPI:  Hypertension- Pt denies chest pain, SOB, dizziness, or heart palpitations.  Taking meds as directed w/o problems.  Denies medication side effects.    Impaired fasting glucose-no increased thirst or urination. No symptoms consistent with hypoglycemia. Has been eating more than usual but has been exercising.   Seasonal allergies -reports he is taking Allegra.   Also c/of neck pain -he says that he notices it more when he has his head flexed forward when he has been reading for a while he says when he tries to straighten it he will feel a sharp pain in the back of his neck.  It radiates down towards the bottom of the neck and about halfway towards the shoulder and most of the trapezius muscle on the left side occasionally.  No recent injury or trauma.  He has been noticing that his neck has been popping and cracking a little more than usual.   Past medical history, Surgical history, Family history not pertinant except as noted below, Social history, Allergies, and medications have been entered into the medical record, reviewed, and corrections made.   Review of Systems: No fevers, chills, night sweats, weight loss, chest pain, or shortness of breath.   Objective:    General: Speaking clearly in complete sentences without any shortness of breath.  Alert and oriented x3.  Normal judgment. No apparent acute distress.  Well-groomed. Msk: He is able to flex and extend his neck.  Is also able to rotate right and left without significant difficulty.  He is pointing to  sort of the mid posterior area as his concentration of pain and then points almost towards the side of the trapezius where it occasionally radiates on that right side.    Impression and Recommendations:    HTN - Well controlled. Continue current regimen. Follow up in  6 months.  Labs ordered.  He can go once they stay at home order has been lifted for the state.  IFG - Well controlled. Continue current regimen. Follow up in  6 months.  Plans on eating heathier.   Neck Pain -we will put a handout with stretches and exercises to do in the after visit summary which he should be able to access through his my chart encouraged him to do those over the next few weeks and if not improving consider getting in with 1 of our sports med docs.  He has previously seen Dr. Dianah Field.  I suspect that he probably just has some arthritis in with that persistent forward flexion of the neck is causing some impingement.  Seasonal allergies -continue Allegra as needed.  Due for screening PSA.    I discussed the assessment and treatment plan with the patient. The patient was provided an opportunity to ask questions and all were answered. The patient agreed with the plan and demonstrated an understanding of the instructions.   The patient was advised to call back or seek an in-person evaluation if the symptoms worsen or if the condition fails to improve as anticipated.  Beatrice Lecher, MD

## 2019-01-28 NOTE — Patient Instructions (Signed)
Cervical Strain and Sprain Rehab Ask your health care provider which exercises are safe for you. Do exercises exactly as told by your health care provider and adjust them as directed. It is normal to feel mild stretching, pulling, tightness, or discomfort as you do these exercises, but you should stop right away if you feel sudden pain or your pain gets worse.Do not begin these exercises until told by your health care provider. Stretching and range of motion exercises These exercises warm up your muscles and joints and improve the movement and flexibility of your neck. These exercises also help to relieve pain, numbness, and tingling. Exercise A: Cervical side bend  1. Using good posture, sit on a stable chair or stand up. 2. Without moving your shoulders, slowly tilt your left / right ear to your shoulder until you feel a stretch in your neck muscles. You should be looking straight ahead. 3. Hold for __________ seconds. 4. Repeat with the other side of your neck. Repeat __________ times. Complete this exercise __________ times a day. Exercise B: Cervical rotation  1. Using good posture, sit on a stable chair or stand up. 2. Slowly turn your head to the side as if you are looking over your left / right shoulder. ? Keep your eyes level with the ground. ? Stop when you feel a stretch along the side and the back of your neck. 3. Hold for __________ seconds. 4. Repeat this by turning to your other side. Repeat __________ times. Complete this exercise __________ times a day. Exercise C: Thoracic extension and pectoral stretch 1. Roll a towel or a small blanket so it is about 4 inches (10 cm) in diameter. 2. Lie down on your back on a firm surface. 3. Put the towel lengthwise, under your spine in the middle of your back. It should not be not under your shoulder blades. The towel should line up with your spine from your middle back to your lower back. 4. Put your hands behind your head and let your  elbows fall out to your sides. 5. Hold for __________ seconds. Repeat __________ times. Complete this exercise __________ times a day. Strengthening exercises These exercises build strength and endurance in your neck. Endurance is the ability to use your muscles for a long time, even after your muscles get tired. Exercise D: Upper cervical flexion, isometric 1. Lie on your back with a thin pillow behind your head and a small rolled-up towel under your neck. 2. Gently tuck your chin toward your chest and nod your head down to look toward your feet. Do not lift your head off the pillow. 3. Hold for __________ seconds. 4. Release the tension slowly. Relax your neck muscles completely before you repeat this exercise. Repeat __________ times. Complete this exercise __________ times a day. Exercise E: Cervical extension, isometric  1. Stand about 6 inches (15 cm) away from a wall, with your back facing the wall. 2. Place a soft object, about 6-8 inches (15-20 cm) in diameter, between the back of your head and the wall. A soft object could be a small pillow, a ball, or a folded towel. 3. Gently tilt your head back and press into the soft object. Keep your jaw and forehead relaxed. 4. Hold for __________ seconds. 5. Release the tension slowly. Relax your neck muscles completely before you repeat this exercise. Repeat __________ times. Complete this exercise __________ times a day. Posture and body mechanics Body mechanics refers to the movements and positions of your   body while you do your daily activities. Posture is part of body mechanics. Good posture and healthy body mechanics can help to relieve stress in your body's tissues and joints. Good posture means that your spine is in its natural S-curve position (your spine is neutral), your shoulders are pulled back slightly, and your head is not tipped forward. The following are general guidelines for applying improved posture and body mechanics to your  everyday activities. Standing   When standing, keep your spine neutral and keep your feet about hip-width apart. Keep a slight bend in your knees. Your ears, shoulders, and hips should line up.  When you do a task in which you stand in one place for a long time, place one foot up on a stable object that is 2-4 inches (5-10 cm) high, such as a footstool. This helps keep your spine neutral. Sitting   When sitting, keep your spine neutral and your keep feet flat on the floor. Use a footrest, if necessary, and keep your thighs parallel to the floor. Avoid rounding your shoulders, and avoid tilting your head forward.  When working at a desk or a computer, keep your desk at a height where your hands are slightly lower than your elbows. Slide your chair under your desk so you are close enough to maintain good posture.  When working at a computer, place your monitor at a height where you are looking straight ahead and you do not have to tilt your head forward or downward to look at the screen. Resting When lying down and resting, avoid positions that are most painful for you. Try to support your neck in a neutral position. You can use a contour pillow or a small rolled-up towel. Your pillow should support your neck but not push on it. This information is not intended to replace advice given to you by your health care provider. Make sure you discuss any questions you have with your health care provider. Document Released: 10/08/2005 Document Revised: 06/14/2016 Document Reviewed: 09/14/2015 Elsevier Interactive Patient Education  2019 [employer].  

## 2019-03-02 ENCOUNTER — Telehealth: Payer: Self-pay

## 2019-03-02 DIAGNOSIS — B078 Other viral warts: Secondary | ICD-10-CM | POA: Diagnosis not present

## 2019-03-02 DIAGNOSIS — D485 Neoplasm of uncertain behavior of skin: Secondary | ICD-10-CM | POA: Diagnosis not present

## 2019-03-02 DIAGNOSIS — L814 Other melanin hyperpigmentation: Secondary | ICD-10-CM | POA: Diagnosis not present

## 2019-03-02 DIAGNOSIS — L821 Other seborrheic keratosis: Secondary | ICD-10-CM | POA: Diagnosis not present

## 2019-03-02 NOTE — Telephone Encounter (Signed)
Pt was seen a couple months ago for Seborrheic Keratoses. States he was given some hydroquinone 2% cream, but wasn't tolerating it well and stopped using it.   Pt was researching and came across hydrogen peroxide 40% topical solution, AKA Eskata. Wanting to know if this is something Dr Madilyn Fireman knows about/would recommend? Also unsure if it was available OTC or if he needed RX?   Please advise

## 2019-03-03 ENCOUNTER — Ambulatory Visit: Payer: Medicare Other

## 2019-03-03 DIAGNOSIS — H903 Sensorineural hearing loss, bilateral: Secondary | ICD-10-CM | POA: Diagnosis not present

## 2019-03-03 MED ORDER — HYDROGEN PEROXIDE 40 % EX SOLN: 4.0000 "application " | Freq: Once | CUTANEOUS | 1 refills | Status: AC

## 2019-03-03 NOTE — Telephone Encounter (Signed)
Okay, I have written this prescription for anyone yet but we can certainly give it a try.  I will send over prescription to [facility] and have them process it.  If it is quite expensive then just let me know and we can try something else instead.

## 2019-03-03 NOTE — Telephone Encounter (Signed)
Pt advised.

## 2019-03-04 ENCOUNTER — Telehealth: Payer: Self-pay | Admitting: Family Medicine

## 2019-03-04 NOTE — Telephone Encounter (Signed)
Received fax from [facility] for a refill on Hydrogen Peroxide (Eskata) 40% Solution stating that this is not found in their system and not available thru [facility].  Called patient and advised him of this and he will call around to pharmacies to see who carried this and we can send over. KG LPN

## 2019-03-10 ENCOUNTER — Telehealth (INDEPENDENT_AMBULATORY_CARE_PROVIDER_SITE_OTHER): Payer: Medicare Other | Admitting: Family Medicine

## 2019-03-10 ENCOUNTER — Encounter: Payer: Self-pay | Admitting: Family Medicine

## 2019-03-10 VITALS — BP 135/75 | HR 68 | Ht 71.0 in | Wt 225.0 lb

## 2019-03-10 DIAGNOSIS — K2100 Gastro-esophageal reflux disease with esophagitis, without bleeding: Secondary | ICD-10-CM

## 2019-03-10 NOTE — Progress Notes (Signed)
Virtual Visit via Video Note  I connected with Glenn Meyer on 03/10/19 at  2:20 PM EDT by a video enabled telemedicine application and verified that I am speaking with the correct person using two identifiers.   I discussed the limitations of evaluation and management by telemedicine and the availability of in person appointments. The patient expressed understanding and agreed to proceed.  Pt was at home and I was in my office for the virtual visit.     Subjective:    CC: GERD   HPI: Pt reports that he has been taking OTC Nexium for GERD for 14 days. He says he had [place] food that really triggered his reflux.  He has hd problems for years.   yesterday was the 14th day and according to the directions on the box it indicates to f/u with your doctor if you continue taking the medicine. Has used Pepcid AC in the past.  No weight changes.  No increase stress.    So he wanted to know if he continued to stay on this daily or should he take this EOD day.   He reports that he has been taking this and it is working well for him. No other red flag symptoms.     Past medical history, Surgical history, Family history not pertinant except as noted below, Social history, Allergies, and medications have been entered into the medical record, reviewed, and corrections made.   Review of Systems: No fevers, chills, night sweats, weight loss, chest pain, or shortness of breath.   Objective:    General: Speaking clearly in complete sentences without any shortness of breath.  Alert and oriented x3.  Normal judgment. No apparent acute distress. Well groomed.    Impression and Recommendations:    GERD - OK to use PPI for 6 weeks and then taper.  Warned about potential for long term side effects. Call if any breakthrough sxs. Reviewed foods to avoid with reflux.       I discussed the assessment and treatment plan with the patient. The patient was provided an opportunity to ask questions and all  were answered. The patient agreed with the plan and demonstrated an understanding of the instructions.   The patient was advised to call back or seek an in-person evaluation if the symptoms worsen or if the condition fails to improve as anticipated.   Beatrice Lecher, MD

## 2019-03-10 NOTE — Progress Notes (Signed)
Pt reports that he has been taking OTC Nexium for GERD for 14 days. yesterday was the 14th day and according to the directions on the box it indicates to f/u with your doctor if you continue taking the medicine.  So he wanted to know if he continued to stay on this daily or should he take this EOD day.   He reports that he has been taking this and it is working well for him and just wanted to speak w/dr.[email or web address] about this before he continued on the medicine.Maryruth Eve, [name]

## 2019-03-17 DIAGNOSIS — M9902 Segmental and somatic dysfunction of thoracic region: Secondary | ICD-10-CM | POA: Diagnosis not present

## 2019-03-17 DIAGNOSIS — M9903 Segmental and somatic dysfunction of lumbar region: Secondary | ICD-10-CM | POA: Diagnosis not present

## 2019-03-17 DIAGNOSIS — M546 Pain in thoracic spine: Secondary | ICD-10-CM | POA: Diagnosis not present

## 2019-03-17 DIAGNOSIS — M542 Cervicalgia: Secondary | ICD-10-CM | POA: Diagnosis not present

## 2019-03-17 DIAGNOSIS — M545 Low back pain: Secondary | ICD-10-CM | POA: Diagnosis not present

## 2019-03-19 DIAGNOSIS — H905 Unspecified sensorineural hearing loss: Secondary | ICD-10-CM | POA: Diagnosis not present

## 2019-03-20 DIAGNOSIS — M542 Cervicalgia: Secondary | ICD-10-CM | POA: Diagnosis not present

## 2019-03-20 DIAGNOSIS — M9902 Segmental and somatic dysfunction of thoracic region: Secondary | ICD-10-CM | POA: Diagnosis not present

## 2019-03-20 DIAGNOSIS — M546 Pain in thoracic spine: Secondary | ICD-10-CM | POA: Diagnosis not present

## 2019-03-20 DIAGNOSIS — M9903 Segmental and somatic dysfunction of lumbar region: Secondary | ICD-10-CM | POA: Diagnosis not present

## 2019-03-20 DIAGNOSIS — M545 Low back pain: Secondary | ICD-10-CM | POA: Diagnosis not present

## 2019-03-24 DIAGNOSIS — M9903 Segmental and somatic dysfunction of lumbar region: Secondary | ICD-10-CM | POA: Diagnosis not present

## 2019-03-24 DIAGNOSIS — M542 Cervicalgia: Secondary | ICD-10-CM | POA: Diagnosis not present

## 2019-03-24 DIAGNOSIS — M546 Pain in thoracic spine: Secondary | ICD-10-CM | POA: Diagnosis not present

## 2019-03-24 DIAGNOSIS — M545 Low back pain: Secondary | ICD-10-CM | POA: Diagnosis not present

## 2019-03-24 DIAGNOSIS — M9902 Segmental and somatic dysfunction of thoracic region: Secondary | ICD-10-CM | POA: Diagnosis not present

## 2019-03-27 DIAGNOSIS — M546 Pain in thoracic spine: Secondary | ICD-10-CM | POA: Diagnosis not present

## 2019-03-27 DIAGNOSIS — M9902 Segmental and somatic dysfunction of thoracic region: Secondary | ICD-10-CM | POA: Diagnosis not present

## 2019-03-27 DIAGNOSIS — M9903 Segmental and somatic dysfunction of lumbar region: Secondary | ICD-10-CM | POA: Diagnosis not present

## 2019-03-27 DIAGNOSIS — M545 Low back pain: Secondary | ICD-10-CM | POA: Diagnosis not present

## 2019-03-27 DIAGNOSIS — M542 Cervicalgia: Secondary | ICD-10-CM | POA: Diagnosis not present

## 2019-04-13 DIAGNOSIS — D045 Carcinoma in situ of skin of trunk: Secondary | ICD-10-CM | POA: Diagnosis not present

## 2019-05-26 ENCOUNTER — Telehealth: Payer: Self-pay

## 2019-05-26 MED ORDER — TADALAFIL 20 MG PO TABS: 10.0000 mg | ORAL_TABLET | ORAL | 11 refills | Status: DC | PRN

## 2019-05-26 NOTE — Telephone Encounter (Signed)
Cialis sent to [facility].  Good Rx will have coupons that make it much cheaper.

## 2019-05-26 NOTE — Telephone Encounter (Signed)
Patient advised.

## 2019-05-26 NOTE — Telephone Encounter (Signed)
Glenn Meyer states he takes 1 tablet of tadalafil 20 mg every other day. He was given a prescription for every other day but was only given 5 tablets. He is in need of a refill. Is it ok to change the amount to 15 tablets? Please advise.     Take 0.5-1 tablets (10-20 mg total) by mouth every other day as needed for erectile dysfunction., Starting Mon 01/26/2019, Normal- Dispense 5 tablets.

## 2019-06-15 DIAGNOSIS — L820 Inflamed seborrheic keratosis: Secondary | ICD-10-CM | POA: Diagnosis not present

## 2019-06-15 DIAGNOSIS — L578 Other skin changes due to chronic exposure to nonionizing radiation: Secondary | ICD-10-CM | POA: Diagnosis not present

## 2019-06-15 DIAGNOSIS — C44529 Squamous cell carcinoma of skin of other part of trunk: Secondary | ICD-10-CM | POA: Diagnosis not present

## 2019-06-15 DIAGNOSIS — D485 Neoplasm of uncertain behavior of skin: Secondary | ICD-10-CM | POA: Diagnosis not present

## 2019-06-22 ENCOUNTER — Encounter: Payer: Self-pay | Admitting: Family Medicine

## 2019-06-23 ENCOUNTER — Ambulatory Visit (INDEPENDENT_AMBULATORY_CARE_PROVIDER_SITE_OTHER): Payer: Medicare Other | Admitting: *Deleted

## 2019-06-23 DIAGNOSIS — Z0000 Encounter for general adult medical examination without abnormal findings: Secondary | ICD-10-CM | POA: Diagnosis not present

## 2019-06-23 NOTE — Progress Notes (Addendum)
Subjective:   Glenn Meyer is a 68 y.o. male who presents for an Initial Medicare Annual Wellness Visit.  Review of Systems  No ROS.  Medicare Wellness Virtual Visit.  Visual/audio telehealth visit, UTA vital signs.   See social history for additional risk factors.    Cardiac Risk Factors include: advanced age (>34men, >65 women);hypertension;dyslipidemia  Sleep patterns:  Getting 7-8 hours of sleep a night. Wakes 1 time a night occasionally. Wakes up feeling refreshed and ready for the day.  Home Safety/Smoke Alarms: Feels safe in home. Smoke alarms in place.  Living environment; Lives with wife in a 2 story home. Stairs have handrails present. Shower is a step over tub grab bars in place. Seat Belt Safety/Bike Helmet: Wears seat belt.    Male:   CCS- postponed until 01/28/2020    PSA-  UTD Lab Results  Component Value Date   PSA 1.7 08/30/2017   PSA 1.4 07/02/2016   PSA 1.04 03/01/2009       Objective:    There were no vitals filed for this visit. There is no height or weight on file to calculate BMI.  Advanced Directives 06/23/2019  Does Patient Have a Medical Advance Directive? Yes  Type of Paramedic of [place];Living will  Does patient want to make changes to medical advance directive? No - Patient declined  Copy of [place] in Chart? No - copy requested    Current Medications (verified) Outpatient Encounter Medications as of 06/23/2019  Medication Sig  . AMBULATORY NON FORMULARY MEDICATION Take by mouth daily. Medication Name: SUPER BETA PROSTATE  . amLODipine (NORVASC) 5 MG tablet Take 1 tablet (5 mg total) by mouth daily.  . Ascorbic Acid (VITAMIN C) 1000 MG tablet Take 1,000 mg by mouth daily.  . Cholecalciferol (D3-1000 PO) Take 1 tablet by mouth daily.  Marland Kitchen esomeprazole (NEXIUM) 20 MG capsule Take 1 capsule by mouth daily.  Marland Kitchen GLUCOSAMINE SULFATE PO Take 1,000 mg by mouth daily.  . Multiple Vitamin (MULTIVITAMIN)  capsule Take 1 capsule by mouth daily.    . Omega-3 Fatty Acids (FISH OIL) 1000 MG CAPS Take by mouth.    . tadalafil (CIALIS) 20 MG tablet Take 0.5-1 tablets (10-20 mg total) by mouth every other day as needed for erectile dysfunction.  . fexofenadine (ALLEGRA) 180 MG tablet Take 180 mg by mouth daily.   No facility-administered encounter medications on file as of 06/23/2019.     Allergies (verified) Lisinopril-hydrochlorothiazide and Meperidine hcl   History: Past Medical History:  Diagnosis Date  . Hyperlipidemia   . Hypertension   . Ringing of ears    chronic X 20 years  . Tinnitus   . TMJ (dislocation of temporomandibular joint)    Past Surgical History:  Procedure Laterality Date  . TONSILECTOMY, ADENOIDECTOMY, BILATERAL MYRINGOTOMY AND TUBES    . TONSILLECTOMY     age 24   Family History  Problem Relation Age of Onset  . Hypertension Mother   . Hyperlipidemia Mother   . Kidney disease Mother        congenital/diaylisis 28's  . Heart disease Father        bypass 2000  . Hypertension Other   . Hyperlipidemia Other    Social History   Socioeconomic History  . Marital status: Married    Spouse name: [name]  . Number of children: 3  . Years of education: 16  . Highest education level: Bachelor's degree (e.g., BA, AB, BS)  Occupational History  . Occupation: Librarian, academic    Comment: retired  Scientific laboratory technician  . Financial resource strain: Not hard at all  . Food insecurity    Worry: Never true    Inability: Never true  . Transportation needs    Medical: No    Non-medical: No  Tobacco Use  . Smoking status: Former Smoker    Years: 3.00    Types: Cigarettes    Quit date: 10/22/1972    Years since quitting: 46.6  . Smokeless tobacco: Never Used  . Tobacco comment: quit 34 years ago  Substance and Sexual Activity  . Alcohol use: Yes    Alcohol/week: 7.0 standard drinks    Types: 7 Standard drinks or equivalent per week    Comment: per week  . Drug use: No   . 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 Physical activity    Days per week: 5 days    Minutes per session: 30 min  . Stress: Not at all  Relationships  . Social connections    Talks on phone: More than three times a week    Gets together: Once a week    Attends religious service: More than 4 times per year    Active member of club or organization: No    Attends meetings of clubs or organizations: Never    Relationship status: Married  Other Topics Concern  . Not on file  Social History Narrative   Retired and takes care of the household duties and yardwork   Tobacco Counseling Counseling given: Not Answered Comment: quit 34 years ago   Clinical Intake:  Pre-visit preparation completed: Yes  Pain : No/denies pain     Nutritional Risks: None Diabetes: No  How often do you need to have someone help you when you read instructions, pamphlets, or other written materials from your doctor or pharmacy?: 1 - Never What is the last grade level you completed in school?: 16  Interpreter Needed?: No  Information entered by :: Orlie Dakin, LPN  Activities of Daily Living In your present state of health, do you have any difficulty performing the following activities: 06/23/2019  Hearing? Y  Comment wears hearing aids in both ears  Vision? N  Difficulty concentrating or making decisions? N  Walking or climbing stairs? N  Dressing or bathing? N  Doing errands, shopping? N  Preparing Food and eating ? N  Using the Toilet? N  In the past six months, have you accidently leaked urine? N  Do you have problems with loss of bowel control? N  Managing your Medications? N  Managing your Finances? N  Housekeeping or managing your Housekeeping? N  Some recent data might be hidden     Immunizations and Health Maintenance Immunization History  Administered Date(s) Administered  . Influenza Split 08/20/2012   . Influenza Whole 09/26/2011  . Influenza, High Dose Seasonal PF 07/30/2018  . Influenza, Seasonal, Injecte, Preservative Fre 09/02/2013  . Influenza,inj,Quad PF,6+ Mos 08/06/2014, 07/29/2015, 07/31/2017  . Influenza-Unspecified 07/22/2013, 08/16/2016  . Pneumococcal Polysaccharide-23 07/30/2018  . Td 10/23/2003  . Tdap 02/15/2016  . Zoster 10/08/2012   Health Maintenance Due  Topic Date Due  . INFLUENZA VACCINE  05/23/2019    Patient Care Team: Hali Marry, MD as PCP - General  Indicate any recent Medical Services you may have received from other than [facility] providers in the past year (date  may be approximate).    Assessment:   This is a routine wellness examination for Joseroberto.Physical assessment deferred to PCP.   Hearing/Vision screen  Hearing Screening   125Hz  250Hz  500Hz  1000Hz  2000Hz  3000Hz  4000Hz  6000Hz  8000Hz   Right ear:           Left ear:           Comments: Hearing test not done, visit done over the phone due to [place] pandemic.  Vision Screening Comments: Vision screening not done, visit done over the telephone due to [place] pandemic  Dietary issues and exercise activities discussed: Current Exercise Habits: Home exercise routine, Type of exercise: walking, Time (Minutes): 30, Frequency (Times/Week): 5, Weekly Exercise (Minutes/Week): 150, Intensity: Mild, Exercise limited by: None identified Diet Eats a healthy diet of vegetables, fruits, protein. Breakfast: egg beaters, [place] sausage and wheat toast Lunch: [place] sandwich and salad Dinner:  Meat or fish and vegetables and dessert     Goals    . Weight (lb) < 200 lb (90.7 kg)     Would like to loose 10 more pounds by cutting down more on carbohydrates.      Depression Screen PHQ 2/9 Scores 06/23/2019 03/10/2019 01/28/2019 01/29/2018  PHQ - 2 Score 0 0 0 0    Fall Risk Fall Risk  06/23/2019 01/29/2018  Falls in the past year? 0 No  Follow up Falls prevention discussed -    Is the patient's home free  of loose throw rugs in walkways, pet beds, electrical cords, etc?   yes      Grab bars in the bathroom? yes      Handrails on the stairs?   yes      Adequate lighting?   yes   Cognitive Function:     6CIT Screen 06/23/2019  What Year? 0 points  What month? 0 points  What time? 0 points  Count back from 20 0 points  Months in reverse 0 points  Repeat phrase 0 points  Total Score 0    Screening Tests Health Maintenance  Topic Date Due  . INFLUENZA VACCINE  05/23/2019  . COLONOSCOPY  01/28/2020 (Originally 08/30/2017)  . PNA vac Low Risk Adult (2 of 2 - PCV13) 07/31/2019  . TETANUS/TDAP  02/14/2026  . Hepatitis C Screening  Completed       Plan:      Mr. Helmer , Thank you for taking time to come for your Medicare Wellness Visit. I appreciate your ongoing commitment to your health goals. Please review the following plan we discussed and let me know if I can assist you in the future.  Please schedule your next medicare wellness visit with me in 1 yr. Continue doing brain stimulating activities (puzzles, reading, adult coloring books, staying active) to keep memory sharp.    These are the goals we discussed: Goals    . Weight (lb) < 200 lb (90.7 kg)     Would like to loose 10 more pounds by cutting down more on carbohydrates.       This is a list of the screening recommended for you and due dates:  Health Maintenance  Topic Date Due  . Flu Shot  05/23/2019  . Colon Cancer Screening  01/28/2020*  . Pneumonia vaccines (2 of 2 - PCV13) 07/31/2019  . Tetanus Vaccine  02/14/2026  .  Hepatitis C: One time screening is recommended by Center for Disease Control  (CDC) for  adults born from 1 through 1965.   Completed  *  Topic was postponed. The date shown is not the original due date.       I have personally reviewed and noted the following in the patient's chart:   . Medical and social history . Use of alcohol, tobacco or illicit drugs  . Current medications and  supplements . Functional ability and status . Nutritional status . Physical activity . Advanced directives . List of other physicians . Hospitalizations, surgeries, and ER visits in previous 12 months . Vitals . Screenings to include cognitive, depression, and falls . Referrals and appointments  In addition, I have reviewed and discussed with patient certain preventive protocols, quality metrics, and best practice recommendations. A written personalized care plan for preventive services as well as general preventive health recommendations were provided to patient.     Beatrice Lecher, MD   06/23/2019

## 2019-06-23 NOTE — Progress Notes (Signed)
Medical screening examination/treatment was performed by qualified clinical staff member and as supervising physician I was immediately available for consultation/collaboration. I have reviewed documentation and agree with assessment and plan.  Beatrice Lecher, MD

## 2019-06-23 NOTE — Patient Instructions (Signed)
Mr. Hot , Thank you for taking time to come for your Medicare Wellness Visit. I appreciate your ongoing commitment to your health goals. Please review the following plan we discussed and let me know if I can assist you in the future.  Please schedule your next medicare wellness visit with me in 1 yr. Continue doing brain stimulating activities (puzzles, reading, adult coloring books, staying active) to keep memory sharp.   These are the goals we discussed: Goals    . Weight (lb) < 200 lb (90.7 kg)     Would like to loose 10 more pounds by cutting down more on carbohydrates.

## 2019-07-29 ENCOUNTER — Other Ambulatory Visit: Payer: Self-pay | Admitting: *Deleted

## 2019-08-19 DIAGNOSIS — L03111 Cellulitis of right axilla: Secondary | ICD-10-CM | POA: Diagnosis not present

## 2019-08-19 DIAGNOSIS — I10 Essential (primary) hypertension: Secondary | ICD-10-CM | POA: Diagnosis not present

## 2019-08-21 DIAGNOSIS — M546 Pain in thoracic spine: Secondary | ICD-10-CM | POA: Diagnosis not present

## 2019-08-21 DIAGNOSIS — M542 Cervicalgia: Secondary | ICD-10-CM | POA: Diagnosis not present

## 2019-08-21 DIAGNOSIS — M9901 Segmental and somatic dysfunction of cervical region: Secondary | ICD-10-CM | POA: Diagnosis not present

## 2019-08-21 DIAGNOSIS — M545 Low back pain: Secondary | ICD-10-CM | POA: Diagnosis not present

## 2019-08-21 DIAGNOSIS — M9902 Segmental and somatic dysfunction of thoracic region: Secondary | ICD-10-CM | POA: Diagnosis not present

## 2019-08-21 DIAGNOSIS — M9903 Segmental and somatic dysfunction of lumbar region: Secondary | ICD-10-CM | POA: Diagnosis not present

## 2019-08-24 ENCOUNTER — Telehealth: Payer: Self-pay | Admitting: Family Medicine

## 2019-08-24 ENCOUNTER — Other Ambulatory Visit: Payer: Self-pay | Admitting: *Deleted

## 2019-08-24 DIAGNOSIS — M9903 Segmental and somatic dysfunction of lumbar region: Secondary | ICD-10-CM | POA: Diagnosis not present

## 2019-08-24 DIAGNOSIS — I10 Essential (primary) hypertension: Secondary | ICD-10-CM

## 2019-08-24 DIAGNOSIS — M9902 Segmental and somatic dysfunction of thoracic region: Secondary | ICD-10-CM | POA: Diagnosis not present

## 2019-08-24 DIAGNOSIS — M545 Low back pain: Secondary | ICD-10-CM | POA: Diagnosis not present

## 2019-08-24 DIAGNOSIS — M9901 Segmental and somatic dysfunction of cervical region: Secondary | ICD-10-CM | POA: Diagnosis not present

## 2019-08-24 DIAGNOSIS — M542 Cervicalgia: Secondary | ICD-10-CM | POA: Diagnosis not present

## 2019-08-24 DIAGNOSIS — M546 Pain in thoracic spine: Secondary | ICD-10-CM | POA: Diagnosis not present

## 2019-08-24 MED ORDER — AMLODIPINE BESYLATE 5 MG PO TABS: 5.0000 mg | ORAL_TABLET | Freq: Every day | ORAL | 3 refills | Status: DC

## 2019-08-24 NOTE — Telephone Encounter (Signed)
PT is requesting a refill on medication.  Amlodipine Besylate 5mg  90ct

## 2019-08-26 DIAGNOSIS — M9902 Segmental and somatic dysfunction of thoracic region: Secondary | ICD-10-CM | POA: Diagnosis not present

## 2019-08-26 DIAGNOSIS — M9903 Segmental and somatic dysfunction of lumbar region: Secondary | ICD-10-CM | POA: Diagnosis not present

## 2019-08-26 DIAGNOSIS — M545 Low back pain: Secondary | ICD-10-CM | POA: Diagnosis not present

## 2019-08-26 DIAGNOSIS — M9901 Segmental and somatic dysfunction of cervical region: Secondary | ICD-10-CM | POA: Diagnosis not present

## 2019-08-26 DIAGNOSIS — M542 Cervicalgia: Secondary | ICD-10-CM | POA: Diagnosis not present

## 2019-08-26 DIAGNOSIS — M546 Pain in thoracic spine: Secondary | ICD-10-CM | POA: Diagnosis not present

## 2019-09-06 ENCOUNTER — Encounter: Payer: Self-pay | Admitting: Emergency Medicine

## 2019-09-06 ENCOUNTER — Emergency Department
Admission: EM | Admit: 2019-09-06 | Discharge: 2019-09-06 | Disposition: A | Discharge status: Home / Self Care | Payer: Medicare Other | Source: Home / Self Care

## 2019-09-06 ENCOUNTER — Other Ambulatory Visit: Payer: Self-pay

## 2019-09-06 DIAGNOSIS — R197 Diarrhea, unspecified: Secondary | ICD-10-CM | POA: Diagnosis not present

## 2019-09-06 MED ORDER — AZITHROMYCIN 250 MG PO TABS: 500.0000 mg | ORAL_TABLET | Freq: Every day | ORAL | 0 refills | Status: AC

## 2019-09-06 NOTE — ED Triage Notes (Signed)
Patient c/o diarrhea on and off since Tuesday, taken Imodium, no fever, vomiting one time on Tuesday, able to keep fluids down.

## 2019-09-06 NOTE — Discharge Instructions (Addendum)
Due to persistent diarrhea, it is suggested a stool culture be performed to help determine the possible cause.  Try to get a sample collected prior to starting the antibiotic in order to not alter the test results.  Once a sample is collected, you can start taking the antibiotic right away. The results can take up to 1 week to come back. If additional antibiotics are indicated based on culture results, we will be able to call in the additional medication to the pharmacy of your choice.  In the meantime, try to stay well hydrated and stick with a bland diet. You may find diarrhea safe diet in this packet.

## 2019-09-06 NOTE — ED Provider Notes (Signed)
[facility]    CSN: [id number] Arrival date & time: 09/06/19  1002      History   Chief Complaint Chief Complaint  Patient presents with  . Diarrhea    HPI Glenn Meyer is a 68 y.o. male.   HPI Glenn Meyer is a 68 y.o. male presenting to UC with c/o intermittent diarrhea for about 6 days.  He had about 5-6 episodes of watery diarrhea and nausea on Tuesday.  Resolved for 2 days after taking imodium. He had about 4 episodes of diarrhea Friday, resolved after taking imodium again, was fine yesterday but woke around 2AM this morning again with 4-5 episodes of watery diarrhea and nausea. Denies abdominal pain, fever, chills or vomiting. No cough or congestion. No sick contacts or recent travel. He did complete a 10 day course of bactrim and keflex prescribed on 08/19/2019 for a skin infection, ended on 11/7 or 11/8 (about 1 week ago).  Pt states he leaves for a road trip to [place] in about 1 week and hopes these symptoms have resolved by then.  He is not concerned for Covid due to lack of any other symptoms.    Past Medical History:  Diagnosis Date  . Hyperlipidemia   . Hypertension   . Ringing of ears    chronic X 20 years  . Tinnitus   . TMJ (dislocation of temporomandibular joint)     Patient Active Problem List   Diagnosis Date Noted  . Patellofemoral arthritis of right knee 10/02/2018  . IFG (impaired fasting glucose) 07/30/2018  . Degenerative arthritis of left hip 10/03/2012  . SHOULDER PAIN, RIGHT 03/17/2008  . Hyperlipidemia 08/08/2007  . HYPERTENSION, BENIGN ESSENTIAL 07/30/2007  . GERD 07/30/2007    Past Surgical History:  Procedure Laterality Date  . TONSILECTOMY, ADENOIDECTOMY, BILATERAL MYRINGOTOMY AND TUBES    . TONSILLECTOMY     age 9       Home Medications    Prior to Admission medications   Medication Sig Start Date End Date Taking? Authorizing Provider  AMBULATORY NON FORMULARY MEDICATION Take by mouth daily. Medication Name:  [place]   Yes [provider]  amLODipine (NORVASC) 5 MG tablet Take 1 tablet (5 mg total) by mouth daily. 08/24/19  Yes Hali Marry, MD  Ascorbic Acid (VITAMIN C) 1000 MG tablet Take 1,000 mg by mouth daily.   Yes [provider]  Cholecalciferol (D3-1000 PO) Take 1 tablet by mouth daily.   Yes [provider]  esomeprazole (NEXIUM) 20 MG capsule Take 1 capsule by mouth daily.   Yes [provider]  GLUCOSAMINE SULFATE PO Take 1,000 mg by mouth daily.   Yes [provider]  Multiple Vitamin (MULTIVITAMIN) capsule Take 1 capsule by mouth daily.     Yes [provider]  Omega-3 Fatty Acids (FISH OIL) 1000 MG CAPS Take by mouth.     Yes [provider]  tadalafil (CIALIS) 20 MG tablet Take 0.5-1 tablets (10-20 mg total) by mouth every other day as needed for erectile dysfunction. 05/26/19  Yes Gregor Hams, MD  azithromycin (ZITHROMAX) 250 MG tablet Take 2 tablets (500 mg total) by mouth daily for 3 days. 09/06/19 09/09/19  Noe Gens, PA-C    Family History Family History  Problem Relation Age of Onset  . Hypertension Mother   . Hyperlipidemia Mother   . Kidney disease Mother        congenital/diaylisis 47's  . Heart disease Father  bypass 2000  . Hypertension Other   . Hyperlipidemia Other     Social History Social History   Tobacco Use  . Smoking status: Former Smoker    Years: 3.00    Types: Cigarettes    Quit date: 10/22/1972    Years since quitting: 46.9  . Smokeless tobacco: Never Used  . Tobacco comment: quit 34 years ago  Substance Use Topics  . Alcohol use: Yes    Alcohol/week: 7.0 standard drinks    Types: 7 Standard drinks or equivalent per week    Comment: per week  . Drug use: No     Allergies   Lisinopril-hydrochlorothiazide and Meperidine hcl   Review of Systems Review of Systems  Constitutional: Negative for chills and fever.  HENT: Negative for congestion, ear  pain, sore throat, trouble swallowing and voice change.   Respiratory: Negative for cough and shortness of breath.   Cardiovascular: Negative for chest pain and palpitations.  Gastrointestinal: Positive for diarrhea and nausea. Negative for abdominal pain, blood in stool and vomiting.       Stomach gurgling but no pain  Musculoskeletal: Negative for arthralgias, back pain and myalgias.  Skin: Negative for rash.  Neurological: Negative for dizziness, light-headedness and headaches.     Physical Exam Triage Vital Signs ED Triage Vitals  Enc Vitals Group     BP 09/06/19 1103 127/82     Pulse Rate 09/06/19 1103 61     Resp --      Temp 09/06/19 1103 98.6 F (37 C)     Temp Source 09/06/19 1103 Oral     SpO2 09/06/19 1103 97 %     Weight 09/06/19 1103 224 lb 12 oz (101.9 kg)     Height --      Head Circumference --      Peak Flow --      Pain Score 09/06/19 1105 0     Pain Loc --      Pain Edu? --      Excl. in [place]? --    No data found.  Updated Vital Signs BP 127/82 (BP Location: Right Arm)   Pulse 61   Temp 98.6 F (37 C) (Oral)   Wt 224 lb 12 oz (101.9 kg)   SpO2 97%   BMI 31.35 kg/m   Visual Acuity Right Eye Distance:   Left Eye Distance:   Bilateral Distance:    Right Eye Near:   Left Eye Near:    Bilateral Near:     Physical Exam Vitals signs and nursing note reviewed.  Constitutional:      General: He is not in acute distress.    Appearance: Normal appearance. He is well-developed. He is not ill-appearing, toxic-appearing or diaphoretic.  HENT:     Head: Normocephalic and atraumatic.     Right Ear: Tympanic membrane and ear canal normal.     Left Ear: Tympanic membrane and ear canal normal.     Nose: Nose normal.     Mouth/Throat:     Mouth: Mucous membranes are moist.     Pharynx: Oropharynx is clear.  Neck:     Musculoskeletal: Normal range of motion.  Cardiovascular:     Rate and Rhythm: Normal rate and regular rhythm.  Pulmonary:     Effort:  Pulmonary effort is normal. No respiratory distress.     Breath sounds: Normal breath sounds.  Abdominal:     General: There is no distension.     Palpations: Abdomen  is soft.     Tenderness: There is no abdominal tenderness. There is no right CVA tenderness or left CVA tenderness.  Musculoskeletal: Normal range of motion.  Skin:    General: Skin is warm and dry.  Neurological:     Mental Status: He is alert and oriented to person, place, and time.  Psychiatric:        Behavior: Behavior normal.      UC Treatments / Results  Labs (all labs ordered are listed, but only abnormal results are displayed) Labs Reviewed  GASTROINTESTINAL PATHOGEN PANEL PCR    EKG   Radiology No results found.  Procedures Procedures (including critical care time)  Medications Ordered in UC Medications - No data to display  Initial Impression / Assessment and Plan / UC Course  I have reviewed the triage vital signs and the nursing notes.  Pertinent labs & imaging results that were available during my care of the patient were reviewed by me and considered in my medical decision making (see chart for details).     Pt appears well, NAD. Suspect infectious diarrhea Stool sample- GI by PCR test ordered Pt unable to provide sample at [facility] as he took imodium PTA. Will start empirically on azithromycin, stressed importance of obtaining sample prior to starting antibiotic AVS provided  Final Clinical Impressions(s) / UC Diagnoses   Final diagnoses:  Diarrhea of presumed infectious origin     Discharge Instructions     Due to persistent diarrhea, it is suggested a stool culture be performed to help determine the possible cause.  Try to get a sample collected prior to starting the antibiotic in order to not alter the test results.  Once a sample is collected, you can start taking the antibiotic right away. The results can take up to 1 week to come back. If additional antibiotics are indicated  based on culture results, we will be able to call in the additional medication to the pharmacy of your choice.  In the meantime, try to stay well hydrated and stick with a bland diet. You may find diarrhea safe diet in this packet.     ED Prescriptions    Medication Sig Dispense Auth. Provider   azithromycin (ZITHROMAX) 250 MG tablet Take 2 tablets (500 mg total) by mouth daily for 3 days. 6 tablet Noe Gens, PA-C     PDMP not reviewed this encounter.   Noe Gens, PA-C 09/06/19 1153

## 2019-09-14 LAB — GASTROINTESTINAL PATHOGEN PANEL PCR

## 2019-11-06 IMAGING — DX DG CERVICAL SPINE COMPLETE 4+V
6 series · 6 of 6 positions shown · non-contrast
Comparison: None.

CLINICAL DATA: Left neck pain for 3 weeks.  No known injury.

EXAM:
CERVICAL SPINE - COMPLETE 4+ VIEW

[c-spine lat]
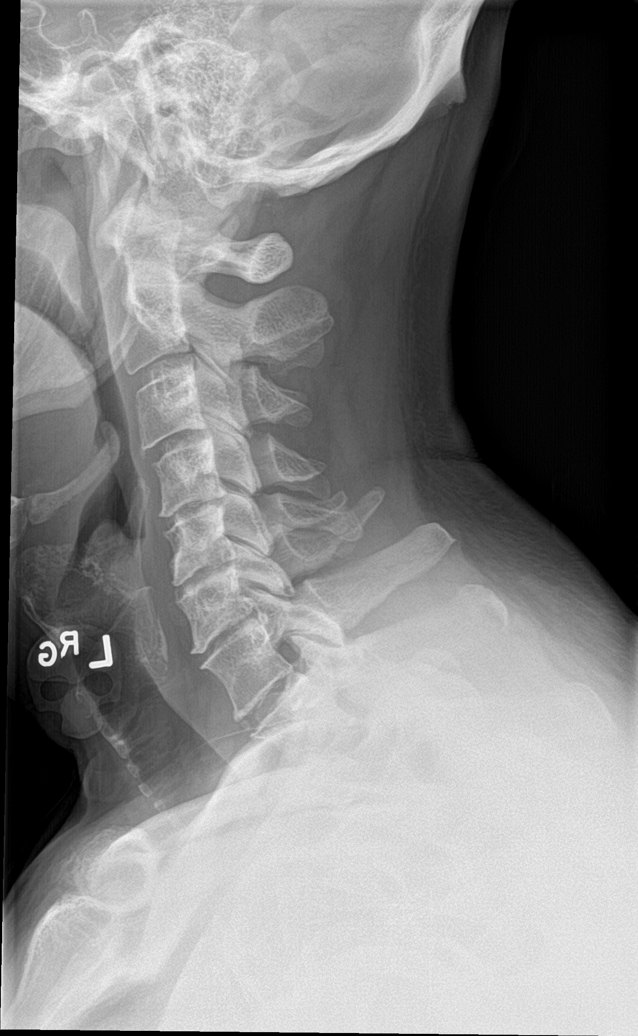

[c-spine obl (1 of 2)]
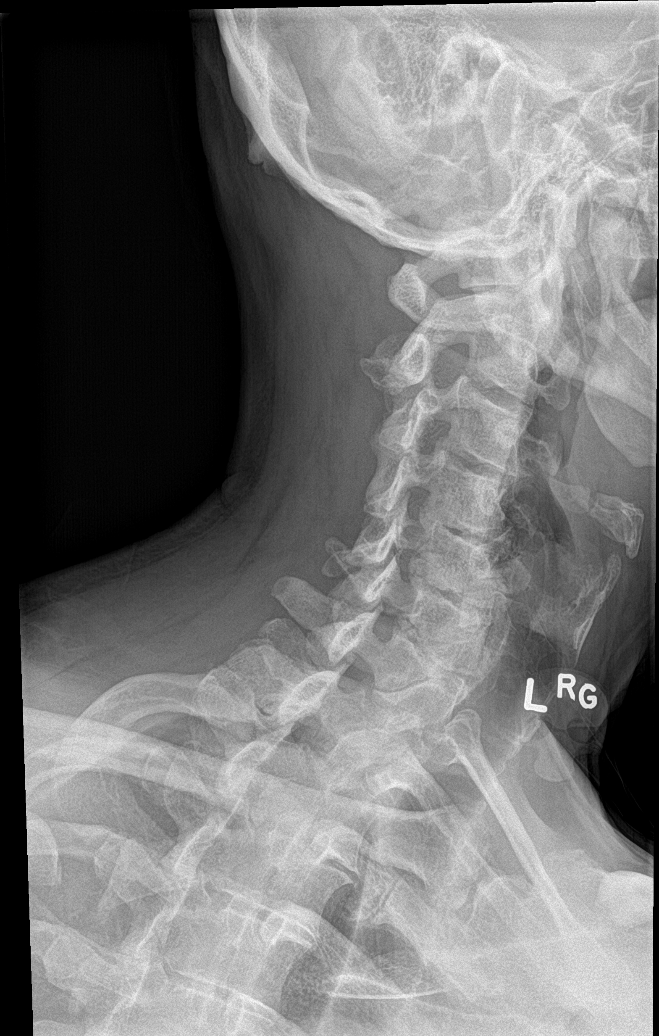

[c-spine obl (2 of 2)]
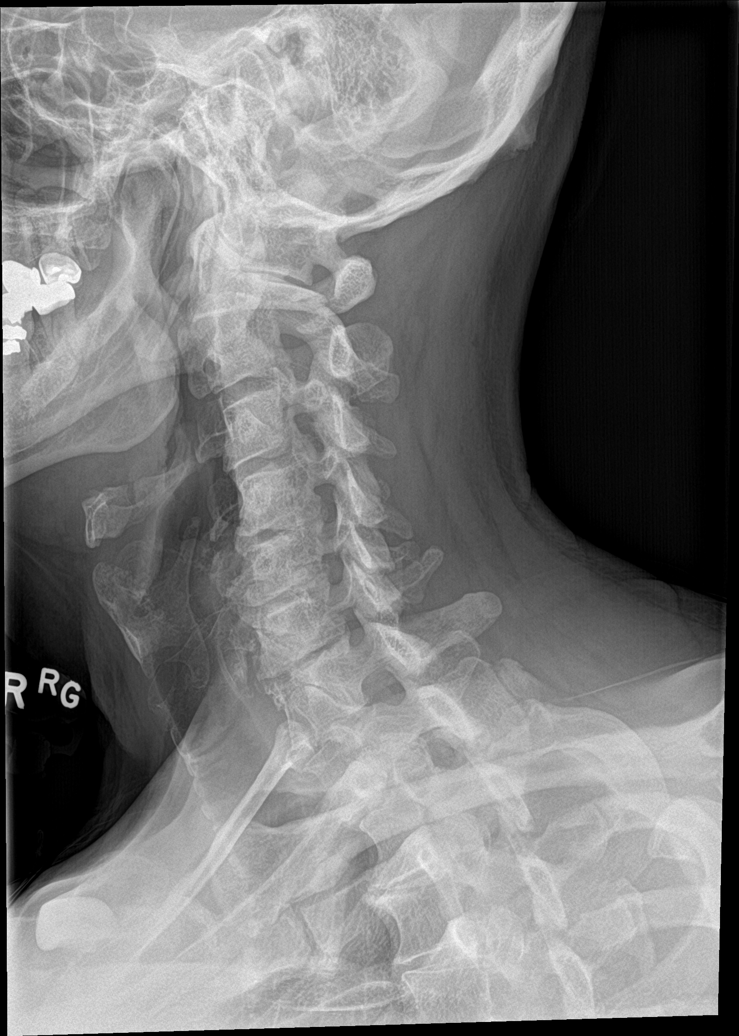

[c-spine ap]
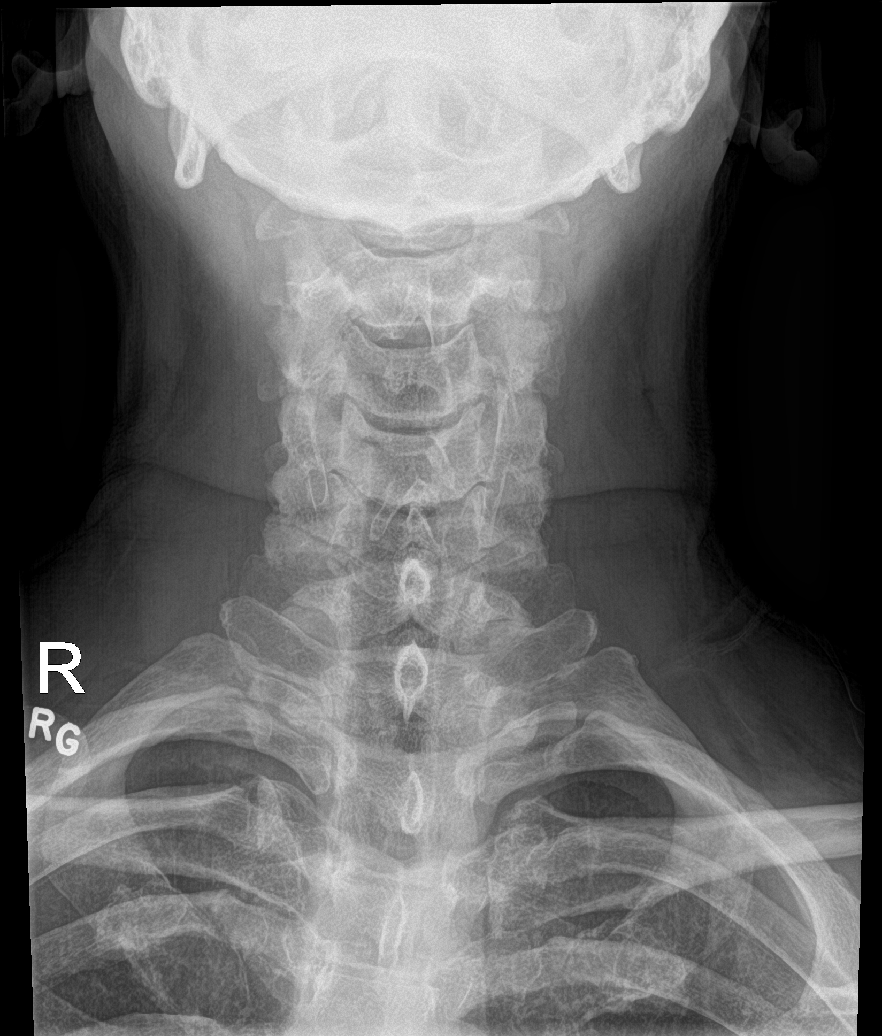

[c-spine open mouth]
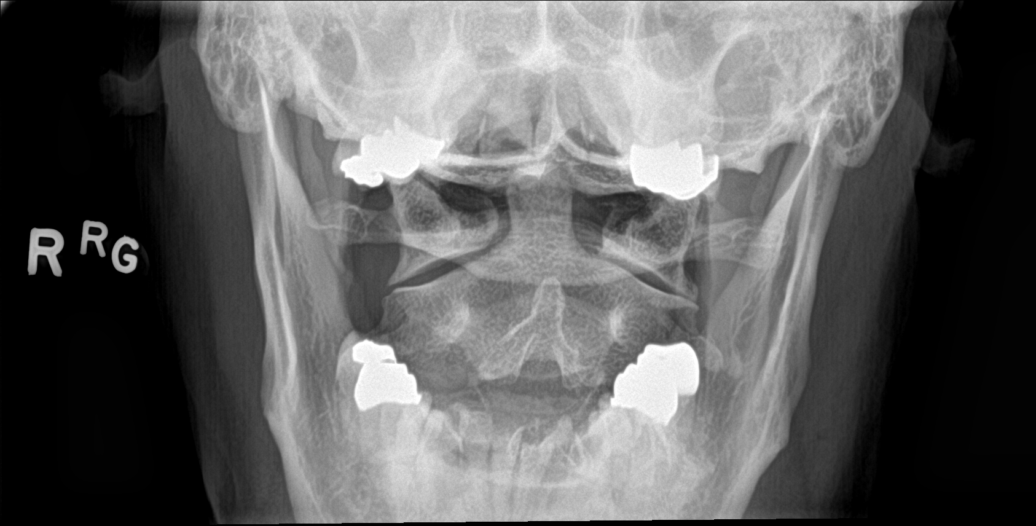

[c-spine swimmers]
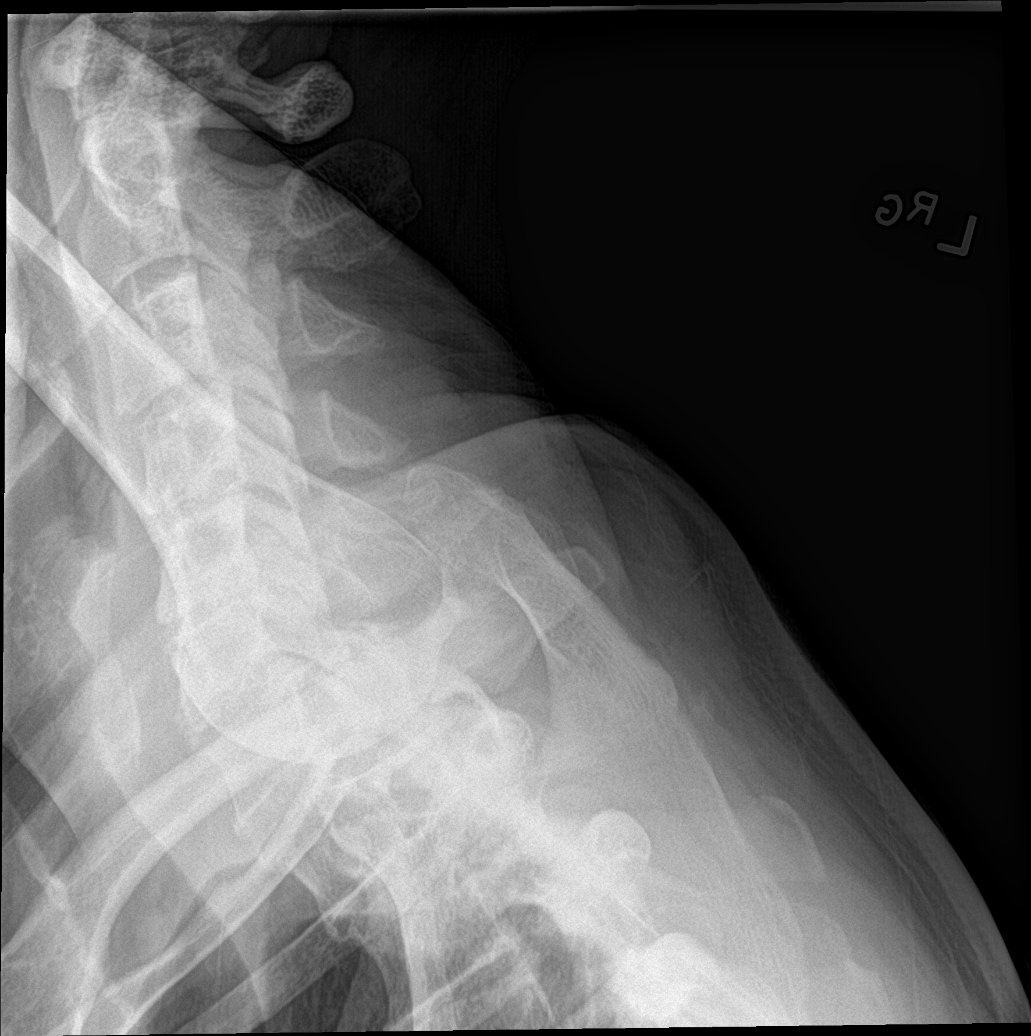

[6 of 6 positions shown; findings below may reference images not displayed]

FINDINGS: Degenerative disc disease throughout the cervical spine, most
notable from C4-5 through C6-7 with disc space narrowing and
spurring. Mild bilateral neural foraminal narrowing due to
uncovertebral spurring from C3-4 through C5-6. Mild diffuse
bilateral degenerative facet disease. No fracture or subluxation.
Prevertebral soft tissues are normal.
IMPRESSION: Degenerative disc and facet disease. Mild bilateral multilevel
neural foraminal narrowing. No acute bony abnormality.

## 2019-12-06 ENCOUNTER — Ambulatory Visit: Payer: Medicare Other | Attending: Internal Medicine

## 2019-12-06 DIAGNOSIS — Z23 Encounter for immunization: Secondary | ICD-10-CM | POA: Insufficient documentation

## 2019-12-06 NOTE — Progress Notes (Signed)
   Covid-19 Vaccination Clinic  Name:  Glenn Meyer    MRN: [id number] DOB: 1951-04-08  12/06/2019  [name] was observed post Covid-19 immunization for 15 minutes without incidence. He was provided with Vaccine Information Sheet and instruction to access the V-Safe system.   [name] was instructed to call 911 with any severe reactions post vaccine: Marland Kitchen Difficulty breathing  . Swelling of your face and throat  . A fast heartbeat  . A bad rash all over your body  . Dizziness and weakness    Immunizations Administered    Name Date Dose VIS Date Route   Pfizer COVID-19 Vaccine 12/06/2019 12:52 PM 0.3 mL 10/02/2019 Intramuscular   Manufacturer: [place]   Lot: [id number]   [place]: [id number]

## 2019-12-11 DIAGNOSIS — H31111 Age-related choroidal atrophy, right eye: Secondary | ICD-10-CM | POA: Diagnosis not present

## 2019-12-11 DIAGNOSIS — H3589 Other specified retinal disorders: Secondary | ICD-10-CM | POA: Diagnosis not present

## 2019-12-11 DIAGNOSIS — H43813 Vitreous degeneration, bilateral: Secondary | ICD-10-CM | POA: Diagnosis not present

## 2019-12-11 DIAGNOSIS — H33321 Round hole, right eye: Secondary | ICD-10-CM | POA: Diagnosis not present

## 2019-12-11 DIAGNOSIS — H35363 Drusen (degenerative) of macula, bilateral: Secondary | ICD-10-CM | POA: Diagnosis not present

## 2019-12-15 DIAGNOSIS — H33321 Round hole, right eye: Secondary | ICD-10-CM | POA: Diagnosis not present

## 2019-12-17 DIAGNOSIS — L853 Xerosis cutis: Secondary | ICD-10-CM | POA: Diagnosis not present

## 2019-12-17 DIAGNOSIS — D485 Neoplasm of uncertain behavior of skin: Secondary | ICD-10-CM | POA: Diagnosis not present

## 2019-12-17 DIAGNOSIS — D225 Melanocytic nevi of trunk: Secondary | ICD-10-CM | POA: Diagnosis not present

## 2019-12-17 DIAGNOSIS — L821 Other seborrheic keratosis: Secondary | ICD-10-CM | POA: Diagnosis not present

## 2019-12-17 DIAGNOSIS — L814 Other melanin hyperpigmentation: Secondary | ICD-10-CM | POA: Diagnosis not present

## 2019-12-29 ENCOUNTER — Ambulatory Visit: Payer: Medicare Other | Attending: Internal Medicine

## 2019-12-29 DIAGNOSIS — Z23 Encounter for immunization: Secondary | ICD-10-CM

## 2019-12-29 NOTE — Progress Notes (Signed)
   Covid-19 Vaccination Clinic  Name:  Glenn Meyer    MRN: [id number] DOB: 09-28-1951  12/29/2019  [name] was observed post Covid-19 immunization for 15 minutes without incident. He was provided with Vaccine Information Sheet and instruction to access the V-Safe system.   [name] was instructed to call 911 with any severe reactions post vaccine: Marland Kitchen Difficulty breathing  . Swelling of face and throat  . A fast heartbeat  . A bad rash all over body  . Dizziness and weakness   Immunizations Administered    Name Date Dose VIS Date Route   Pfizer COVID-19 Vaccine 12/29/2019  5:32 PM 0.3 mL 10/02/2019 Intramuscular   Manufacturer: [place]   Lot: [id number]   [place]: [id number]

## 2020-03-02 ENCOUNTER — Telehealth: Payer: Self-pay

## 2020-03-02 DIAGNOSIS — L814 Other melanin hyperpigmentation: Secondary | ICD-10-CM | POA: Diagnosis not present

## 2020-03-02 DIAGNOSIS — L720 Epidermal cyst: Secondary | ICD-10-CM | POA: Diagnosis not present

## 2020-03-02 NOTE — Telephone Encounter (Signed)
BELDON SHAAK called and left a message stating the insurance company will be sending a medical records release to the office. He states it is ok to release the information.

## 2020-03-07 DIAGNOSIS — L720 Epidermal cyst: Secondary | ICD-10-CM | POA: Diagnosis not present

## 2020-04-12 DIAGNOSIS — L720 Epidermal cyst: Secondary | ICD-10-CM | POA: Diagnosis not present

## 2020-06-15 ENCOUNTER — Other Ambulatory Visit: Payer: Self-pay | Admitting: Family Medicine

## 2020-06-21 ENCOUNTER — Other Ambulatory Visit: Payer: Self-pay | Admitting: Family Medicine

## 2020-07-19 ENCOUNTER — Ambulatory Visit (INDEPENDENT_AMBULATORY_CARE_PROVIDER_SITE_OTHER): Payer: Medicare Other | Admitting: Family Medicine

## 2020-07-19 ENCOUNTER — Encounter: Payer: Self-pay | Admitting: Family Medicine

## 2020-07-19 VITALS — BP 129/76 | HR 62 | Ht 76.0 in | Wt 225.0 lb

## 2020-07-19 DIAGNOSIS — K2100 Gastro-esophageal reflux disease with esophagitis, without bleeding: Secondary | ICD-10-CM

## 2020-07-19 DIAGNOSIS — Z0000 Encounter for general adult medical examination without abnormal findings: Secondary | ICD-10-CM

## 2020-07-19 DIAGNOSIS — R635 Abnormal weight gain: Secondary | ICD-10-CM | POA: Diagnosis not present

## 2020-07-19 DIAGNOSIS — R351 Nocturia: Secondary | ICD-10-CM

## 2020-07-19 DIAGNOSIS — N401 Enlarged prostate with lower urinary tract symptoms: Secondary | ICD-10-CM | POA: Diagnosis not present

## 2020-07-19 DIAGNOSIS — Z23 Encounter for immunization: Secondary | ICD-10-CM

## 2020-07-19 DIAGNOSIS — Z0001 Encounter for general adult medical examination with abnormal findings: Secondary | ICD-10-CM

## 2020-07-19 MED ORDER — METFORMIN HCL 500 MG PO TABS: 500.0000 mg | ORAL_TABLET | Freq: Two times a day (BID) | ORAL | 1 refills | Status: DC

## 2020-07-19 MED ORDER — TADALAFIL 5 MG PO TABS: 5.0000 mg | ORAL_TABLET | Freq: Every day | ORAL | 3 refills | Status: DC | PRN

## 2020-07-19 NOTE — Progress Notes (Signed)
CPE  Established Patient Office Visit  Subjective:  Patient ID: Glenn Meyer, male    DOB: 15-May-1951  Age: 69 y.o. MRN: [id number]  CC:  Chief Complaint  Patient presents with  . Annual Exam    HPI Glenn Meyer presents for CPE.  He is overall doing well and feeling well.   Also due for colon cancer screening.  Past Medical History:  Diagnosis Date  . Hyperlipidemia   . Hypertension   . Ringing of ears    chronic X 20 years  . Tinnitus   . TMJ (dislocation of temporomandibular joint)     Past Surgical History:  Procedure Laterality Date  . TONSILECTOMY, ADENOIDECTOMY, BILATERAL MYRINGOTOMY AND TUBES    . TONSILLECTOMY     age 20    Family History  Problem Relation Age of Onset  . Hypertension Mother   . Hyperlipidemia Mother   . Kidney disease Mother        congenital/diaylisis 67's  . Heart disease Father        bypass 2000  . Hypertension Other   . Hyperlipidemia Other     Social History   Socioeconomic History  . Marital status: Married    Spouse name: [name]  . Number of children: 3  . Years of education: 16  . Highest education level: Bachelor's degree (e.g., BA, AB, BS)  Occupational History  . Occupation: Librarian, academic    Comment: retired  Tobacco Use  . Smoking status: Former Smoker    Years: 3.00    Types: Cigarettes    Quit date: 10/22/1972    Years since quitting: 47.7  . Smokeless tobacco: Never Used  . Tobacco comment: quit 34 years ago  Vaping Use  . Vaping Use: Never used  Substance and Sexual Activity  . Alcohol use: Yes    Alcohol/week: 7.0 standard drinks    Types: 7 Standard drinks or equivalent per week    Comment: per week  . Drug use: No  . 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 Difficulty of Paying Living Expenses: Not on file  Food Insecurity:   . Worried About Charity fundraiser in the Last Year: Not on file  . Ran Out of Food in the Last Year: Not on file  Transportation Needs:   . Lack of Transportation (Medical): Not on file  . Lack of Transportation (Non-Medical): Not on file  Physical Activity:   . Days of Exercise per Week: Not on file  . Minutes of Exercise per Session: Not on file  Stress:   . Feeling of Stress : Not on file  Social Connections:   . Frequency of Communication with Friends and Family: Not on file  . Frequency of Social Gatherings with Friends and Family: Not on file  . Attends Religious Services: Not on file  . Active Member of Clubs or Organizations: Not on file  . Attends Archivist Meetings: Not on file  . Marital Status: Not on file  Intimate Partner Violence:   . Fear of Current or Ex-Partner: Not on file  . Emotionally Abused: Not on file  . Physically Abused: Not on file  . Sexually Abused: Not on  file    Outpatient Medications Prior to Visit  Medication Sig Dispense Refill  . amLODipine (NORVASC) 5 MG tablet Take 1 tablet (5 mg total) by mouth daily. 90 tablet 3  . Ascorbic Acid (VITAMIN C) 1000 MG tablet Take 1,000 mg by mouth daily.    . Cholecalciferol (D3-1000 PO) Take 1 tablet by mouth daily.    Marland Kitchen GLUCOSAMINE SULFATE PO Take 1,000 mg by mouth daily.    . Multiple Vitamin (MULTIVITAMIN) capsule Take 1 capsule by mouth daily.      . Omega-3 Fatty Acids (FISH OIL) 1000 MG CAPS Take by mouth.      . AMBULATORY NON FORMULARY MEDICATION Take by mouth daily. Medication Name: SUPER BETA PROSTATE    . esomeprazole (NEXIUM) 20 MG capsule Take 1 capsule by mouth daily.    . tadalafil (CIALIS) 20 MG tablet TAKE 1/2 TO 1 (ONE-HALF TO ONE) TABLET BY MOUTH EVERY OTHER DAY AS NEEDED FOR ERECTILE DYSFUNCTION 30 tablet 0   No facility-administered medications prior to visit.    Allergies   Allergen Reactions  . Lisinopril-Hydrochlorothiazide     REACTION: Cough  . Meperidine Hcl     REACTION: nausea    ROS Review of Systems    Objective:    Physical Exam Constitutional:      Appearance: He is well-developed.  HENT:     Head: Normocephalic and atraumatic.     Right Ear: External ear normal.     Left Ear: External ear normal.     Nose: Nose normal.  Eyes:     Conjunctiva/sclera: Conjunctivae normal.     Pupils: Pupils are equal, round, and reactive to light.  Neck:     Thyroid: No thyromegaly.  Cardiovascular:     Rate and Rhythm: Normal rate and regular rhythm.     Heart sounds: Normal heart sounds.  Pulmonary:     Effort: Pulmonary effort is normal.     Breath sounds: Normal breath sounds.  Abdominal:     General: Bowel sounds are normal. There is no distension.     Palpations: Abdomen is soft. There is no mass.     Tenderness: There is no abdominal tenderness. There is no guarding or rebound.  Musculoskeletal:        General: Normal range of motion.     Cervical back: Normal range of motion and neck supple.  Lymphadenopathy:     Cervical: No cervical adenopathy.  Skin:    General: Skin is warm and dry.  Neurological:     Mental Status: He is alert and oriented to person, place, and time.     Deep Tendon Reflexes: Reflexes are normal and symmetric.  Psychiatric:        Behavior: Behavior normal.        Thought Content: Thought content normal.        Judgment: Judgment normal.     BP 129/76   Pulse 62   Ht 6\' 4"  (1.93 m)   Wt 225 lb (102.1 kg)   SpO2 100%   BMI 27.39 kg/m  Wt Readings from Last 3 Encounters:  07/19/20 225 lb (102.1 kg)  09/06/19 224 lb 12 oz (101.9 kg)  03/10/19 225 lb (102.1 kg)     Health Maintenance Due  Topic Date Due  . COLONOSCOPY  08/30/2017    There are no preventive care reminders to display for this patient.  Lab Results  Component Value Date   TSH 1.38 08/30/2017   Lab Results  Component Value  Date   WBC 4.7 08/30/2017   HGB 15.5 08/30/2017   HCT 46.3 08/30/2017   MCV 85.7 08/30/2017   PLT 206 08/30/2017   Lab Results  Component Value Date   NA 138 01/31/2018   K 4.6 01/31/2018   CO2 27 01/31/2018   GLUCOSE 109 (H) 01/31/2018   BUN 21 01/31/2018   CREATININE 1.04 01/31/2018   BILITOT 0.5 08/30/2017   ALKPHOS 56 08/03/2015   AST 28 08/30/2017   ALT 28 08/30/2017   PROT 6.8 08/30/2017   ALBUMIN 4.1 08/03/2015   CALCIUM 9.2 01/31/2018   Lab Results  Component Value Date   CHOL 215 (H) 08/30/2017   Lab Results  Component Value Date   HDL 50 08/30/2017   Lab Results  Component Value Date   LDLCALC 144 (H) 08/30/2017   Lab Results  Component Value Date   TRIG 98 08/30/2017   Lab Results  Component Value Date   CHOLHDL 4.3 08/30/2017   Lab Results  Component Value Date   HGBA1C 5.7 (A) 07/30/2018      Assessment & Plan:   Problem List Items Addressed This Visit      Digestive   GERD    Discussed long-term consequences of chronic PPI use.  But it can be used long-term if needed and he is not getting relief with an H2 blocker.  Reviewed dietary measures.  He already stays away from soda etc.  Okay to continue Nexium daily or even every other day if needed.        Other   BPH associated with nocturia    Discussed that typically the dose used on the Cialis for symptoms related to BPH is 5 mg so he is actually taking too much.  He would like to try the low-dose daily regimen to see if this helps continues to help with his nocturia as well as erectile dysfunction.  New prescription sent to pharmacy for 5 mg daily.      Relevant Medications   tadalafil (CIALIS) 5 MG tablet   Abnormal weight gain    Abnormal weight gain BMI is 27 he is exercising regularly and trying to eat healthy but he really would like something to curb appetite we discussed a stimulant as possible option though I am concerned because he is over 60 and would require an EKG before we  start the medication.  We also discussed off label use of Metformin which can be used sometimes but did warn about potential side effects including diarrhea with this medication he would like to try for 30 days to see if it is helpful.       Other Visit Diagnoses    Need for immunization against influenza    -  Primary   Relevant Orders   Flu Vaccine QUAD High Dose(Fluad) (Completed)   Wellness examination       Relevant Orders   CBC   COMPLETE METABOLIC PANEL WITH GFR   Lipid panel   PSA   Need for prophylactic vaccination against Streptococcus pneumoniae (pneumococcus) and influenza       Relevant Orders   Pneumococcal conjugate vaccine 13-valent (Completed)     Keep up a regular exercise program and make sure you are eating a healthy diet Try to eat 4 servings of dairy a day, or if you are lactose intolerant take a calcium with vitamin D daily.  Your vaccines are up to date.  Gust colon cancer screening options he is  interested in doing Cologuard. Tdap updated.  He has not had the Pneumovax 23.  Due for Prevnar 13. Flu vaccine given.  Meds ordered this encounter  Medications  . tadalafil (CIALIS) 5 MG tablet    Sig: Take 1 tablet (5 mg total) by mouth daily as needed for erectile dysfunction.    Dispense:  90 tablet    Refill:  3  . metFORMIN (GLUCOPHAGE) 500 MG tablet    Sig: Take 1 tablet (500 mg total) by mouth 2 (two) times daily with a meal.    Dispense:  60 tablet    Refill:  1    Follow-up: No follow-ups on file.    Beatrice Lecher, MD

## 2020-07-19 NOTE — Patient Instructions (Signed)
Health Maintenance After Age 68 After age 68, you are at a higher risk for certain long-term diseases and infections as well as injuries from falls. Falls are a major cause of broken bones and head injuries in people who are older than age 68. Getting regular preventive care can help to keep you healthy and well. Preventive care includes getting regular testing and making lifestyle changes as recommended by your health care provider. Talk with your health care provider about:  Which screenings and tests you should have. A screening is a test that checks for a disease when you have no symptoms.  A diet and exercise plan that is right for you. What should I know about screenings and tests to prevent falls? Screening and testing are the best ways to find a health problem early. Early diagnosis and treatment give you the best chance of managing medical conditions that are common after age 68. Certain conditions and lifestyle choices may make you more likely to have a fall. Your health care provider may recommend:  Regular vision checks. Poor vision and conditions such as cataracts can make you more likely to have a fall. If you wear glasses, make sure to get your prescription updated if your vision changes.  Medicine review. Work with your health care provider to regularly review all of the medicines you are taking, including over-the-counter medicines. Ask your health care provider about any side effects that may make you more likely to have a fall. Tell your health care provider if any medicines that you take make you feel dizzy or sleepy.  Osteoporosis screening. Osteoporosis is a condition that causes the bones to get weaker. This can make the bones weak and cause them to break more easily.  Blood pressure screening. Blood pressure changes and medicines to control blood pressure can make you feel dizzy.  Strength and balance checks. Your health care provider may recommend certain tests to check your  strength and balance while standing, walking, or changing positions.  Foot health exam. Foot pain and numbness, as well as not wearing proper footwear, can make you more likely to have a fall.  Depression screening. You may be more likely to have a fall if you have a fear of falling, feel emotionally low, or feel unable to do activities that you used to do.  Alcohol use screening. Using too much alcohol can affect your balance and may make you more likely to have a fall. What actions can I take to lower my risk of falls? General instructions  Talk with your health care provider about your risks for falling. Tell your health care provider if: ? You fall. Be sure to tell your health care provider about all falls, even ones that seem minor. ? You feel dizzy, sleepy, or off-balance.  Take over-the-counter and prescription medicines only as told by your health care provider. These include any supplements.  Eat a healthy diet and maintain a healthy weight. A healthy diet includes low-fat dairy products, low-fat (lean) meats, and fiber from whole grains, beans, and lots of fruits and vegetables. Home safety  Remove any tripping hazards, such as rugs, cords, and clutter.  Install safety equipment such as grab bars in bathrooms and safety rails on stairs.  Keep rooms and walkways well-lit. Activity   Follow a regular exercise program to stay fit. This will help you maintain your balance. Ask your health care provider what types of exercise are appropriate for you.  If you need a cane or   walker, use it as recommended by your health care provider.  Wear supportive shoes that have nonskid soles. Lifestyle  Do not drink alcohol if your health care provider tells you not to drink.  If you drink alcohol, limit how much you have: ? 0-1 drink a day for women. ? 0-2 drinks a day for men.  Be aware of how much alcohol is in your drink. In the [place] one drink equals one typical bottle of beer (12  oz), one-half glass of wine (5 oz), or one shot of hard liquor (1 oz).  Do not use any products that contain nicotine or tobacco, such as cigarettes and e-cigarettes. If you need help quitting, ask your health care provider. Summary  Having a healthy lifestyle and getting preventive care can help to protect your health and wellness after age 68.  Screening and testing are the best way to find a health problem early and help you avoid having a fall. Early diagnosis and treatment give you the best chance for managing medical conditions that are more common for people who are older than age 68.  Falls are a major cause of broken bones and head injuries in people who are older than age 68. Take precautions to prevent a fall at home.  Work with your health care provider to learn what changes you can make to improve your health and wellness and to prevent falls. This information is not intended to replace advice given to you by your health care provider. Make sure you discuss any questions you have with your health care provider. Document Revised: 01/29/2019 Document Reviewed: 08/21/2017 Elsevier Patient Education  2020 Elsevier Inc[employer]  

## 2020-07-19 NOTE — Assessment & Plan Note (Signed)
Discussed long-term consequences of chronic PPI use.  But it can be used long-term if needed and he is not getting relief with an H2 blocker.  Reviewed dietary measures.  He already stays away from soda etc.  Okay to continue Nexium daily or even every other day if needed.

## 2020-07-19 NOTE — Progress Notes (Signed)
Office Visit  Subjective:    Patient ID: Glenn Meyer, male    DOB: Jan 13, 1951, 69 y.o.   MRN: [id number]  Chief Complaint  Patient presents with  . Annual Exam    HPI Patient is in today for  He is not happy with his current weight he has been trying to exercise 3 days a week and eat healthy but just cannot seem to quite lose the weight he really gets increased cravings in the afternoons and would like to talk about prescription options to help with that.  He also has persistent reflux which has had for years.  He has taken Nexium on and off but more recently had switched to famotidine after reading some literature about risks of long-term PPIs.  He just says the famotidine even with taking 40 mg at bedtime does not seem to be that helpful.  He has cut out soda and has reduced alcohol.  Also he wanted to ask about his Cialis he has a prescription for 20 mg which she sometimes splits he had read some information about taking it daily so he had actually been taking about half a tab daily for the last couple of weeks he noticed that it was really helping with his nocturia he would typically wake up about twice per night to urinate and now it is either 0 or once.  Past Medical History:  Diagnosis Date  . Hyperlipidemia   . Hypertension   . Ringing of ears    chronic X 20 years  . Tinnitus   . TMJ (dislocation of temporomandibular joint)     Past Surgical History:  Procedure Laterality Date  . TONSILECTOMY, ADENOIDECTOMY, BILATERAL MYRINGOTOMY AND TUBES    . TONSILLECTOMY     age 22    Family History  Problem Relation Age of Onset  . Hypertension Mother   . Hyperlipidemia Mother   . Kidney disease Mother        congenital/diaylisis 34's  . Heart disease Father        bypass 2000  . Hypertension Other   . Hyperlipidemia Other     Social History   Socioeconomic History  . Marital status: Married    Spouse name: [name]  . Number of children: 3  . Years of education:  16  . Highest education level: Bachelor's degree (e.g., BA, AB, BS)  Occupational History  . Occupation: Librarian, academic    Comment: retired  Tobacco Use  . Smoking status: Former Smoker    Years: 3.00    Types: Cigarettes    Quit date: 10/22/1972    Years since quitting: 47.7  . Smokeless tobacco: Never Used  . Tobacco comment: quit 34 years ago  Vaping Use  . Vaping Use: Never used  Substance and Sexual Activity  . Alcohol use: Yes    Alcohol/week: 7.0 standard drinks    Types: 7 Standard drinks or equivalent per week    Comment: per week  . Drug use: No  . 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 Difficulty of Paying Living Expenses: Not on file  Food Insecurity:   . Worried About Charity fundraiser in the Last Year: Not on file  .  Ran Out of Food in the Last Year: Not on file  Transportation Needs:   . Lack of Transportation (Medical): Not on file  . Lack of Transportation (Non-Medical): Not on file  Physical Activity:   . Days of Exercise per Week: Not on file  . Minutes of Exercise per Session: Not on file  Stress:   . Feeling of Stress : Not on file  Social Connections:   . Frequency of Communication with Friends and Family: Not on file  . Frequency of Social Gatherings with Friends and Family: Not on file  . Attends Religious Services: Not on file  . Active Member of Clubs or Organizations: Not on file  . Attends Archivist Meetings: Not on file  . Marital Status: Not on file  Intimate Partner Violence:   . Fear of Current or Ex-Partner: Not on file  . Emotionally Abused: Not on file  . Physically Abused: Not on file  . Sexually Abused: Not on file    Outpatient Medications Prior to Visit   Medication Sig Dispense Refill  . amLODipine (NORVASC) 5 MG tablet Take 1 tablet (5 mg total) by mouth daily. 90 tablet 3  . Ascorbic Acid (VITAMIN C) 1000 MG tablet Take 1,000 mg by mouth daily.    . Cholecalciferol (D3-1000 PO) Take 1 tablet by mouth daily.    Marland Kitchen GLUCOSAMINE SULFATE PO Take 1,000 mg by mouth daily.    . Multiple Vitamin (MULTIVITAMIN) capsule Take 1 capsule by mouth daily.      . Omega-3 Fatty Acids (FISH OIL) 1000 MG CAPS Take by mouth.      . AMBULATORY NON FORMULARY MEDICATION Take by mouth daily. Medication Name: SUPER BETA PROSTATE    . esomeprazole (NEXIUM) 20 MG capsule Take 1 capsule by mouth daily.    . tadalafil (CIALIS) 20 MG tablet TAKE 1/2 TO 1 (ONE-HALF TO ONE) TABLET BY MOUTH EVERY OTHER DAY AS NEEDED FOR ERECTILE DYSFUNCTION 30 tablet 0   No facility-administered medications prior to visit.    Allergies  Allergen Reactions  . Lisinopril-Hydrochlorothiazide     REACTION: Cough  . Meperidine Hcl     REACTION: nausea    Review of Systems     Objective:    Physical Exam  BP 129/76   Pulse 62   Ht 6\' 4"  (1.93 m)   Wt 225 lb (102.1 kg)   SpO2 100%   BMI 27.39 kg/m  Wt Readings from Last 3 Encounters:  07/19/20 225 lb (102.1 kg)  09/06/19 224 lb 12 oz (101.9 kg)  03/10/19 225 lb (102.1 kg)    Health Maintenance Due  Topic Date Due  . COLONOSCOPY  08/30/2017    There are no preventive care reminders to display for this patient.   Lab Results  Component Value Date   TSH 1.38 08/30/2017   Lab Results  Component Value Date   WBC 4.7 08/30/2017   HGB 15.5 08/30/2017   HCT 46.3 08/30/2017   MCV 85.7 08/30/2017   PLT 206 08/30/2017   Lab Results  Component Value Date   NA 138 01/31/2018   K 4.6 01/31/2018   CO2 27 01/31/2018   GLUCOSE 109 (H) 01/31/2018   BUN 21 01/31/2018   CREATININE 1.04 01/31/2018   BILITOT 0.5 08/30/2017   ALKPHOS 56 08/03/2015   AST 28 08/30/2017   ALT 28 08/30/2017   PROT 6.8 08/30/2017   ALBUMIN  4.1 08/03/2015   CALCIUM 9.2 01/31/2018   Lab Results  Component Value Date   CHOL 215 (H) 08/30/2017   Lab Results  Component Value Date   HDL 50 08/30/2017   Lab Results  Component Value Date   LDLCALC 144 (H) 08/30/2017   Lab Results  Component Value Date   TRIG 98 08/30/2017   Lab Results  Component Value Date   CHOLHDL 4.3 08/30/2017   Lab Results  Component Value Date   HGBA1C 5.7 (A) 07/30/2018       Assessment & Plan:   Problem List Items Addressed This Visit      Digestive   GERD    Discussed long-term consequences of chronic PPI use.  But it can be used long-term if needed and he is not getting relief with an H2 blocker.  Reviewed dietary measures.  He already stays away from soda etc.  Okay to continue Nexium daily or even every other day if needed.        Other   BPH associated with nocturia    Discussed that typically the dose used on the Cialis for symptoms related to BPH is 5 mg so he is actually taking too much.  He would like to try the low-dose daily regimen to see if this helps continues to help with his nocturia as well as erectile dysfunction.  New prescription sent to pharmacy for 5 mg daily.      Relevant Medications   tadalafil (CIALIS) 5 MG tablet   Abnormal weight gain    Abnormal weight gain BMI is 27 he is exercising regularly and trying to eat healthy but he really would like something to curb appetite we discussed a stimulant as possible option though I am concerned because he is over 60 and would require an EKG before we start the medication.  We also discussed off label use of Metformin which can be used sometimes but did warn about potential side effects including diarrhea with this medication he would like to try for 30 days to see if it is helpful.       Other Visit Diagnoses    Need for immunization against influenza    -  Primary   Relevant Orders   Flu Vaccine QUAD High Dose(Fluad) (Completed)   Wellness examination        Relevant Orders   CBC   COMPLETE METABOLIC PANEL WITH GFR   Lipid panel   PSA   Need for prophylactic vaccination against Streptococcus pneumoniae (pneumococcus) and influenza       Relevant Orders   Pneumococcal conjugate vaccine 13-valent (Completed)       Meds ordered this encounter  Medications  . tadalafil (CIALIS) 5 MG tablet    Sig: Take 1 tablet (5 mg total) by mouth daily as needed for erectile dysfunction.    Dispense:  90 tablet    Refill:  3  . metFORMIN (GLUCOPHAGE) 500 MG tablet    Sig: Take 1 tablet (500 mg total) by mouth 2 (two) times daily with a meal.    Dispense:  60 tablet    Refill:  1     Beatrice Lecher, MD

## 2020-07-19 NOTE — Assessment & Plan Note (Signed)
Discussed that typically the dose used on the Cialis for symptoms related to BPH is 5 mg so he is actually taking too much.  He would like to try the low-dose daily regimen to see if this helps continues to help with his nocturia as well as erectile dysfunction.  New prescription sent to pharmacy for 5 mg daily.

## 2020-07-19 NOTE — Assessment & Plan Note (Signed)
Abnormal weight gain BMI is 27 he is exercising regularly and trying to eat healthy but he really would like something to curb appetite we discussed a stimulant as possible option though I am concerned because he is over 61 and would require an EKG before we start the medication.  We also discussed off label use of Metformin which can be used sometimes but did warn about potential side effects including diarrhea with this medication he would like to try for 30 days to see if it is helpful.

## 2020-07-22 DIAGNOSIS — Z0000 Encounter for general adult medical examination without abnormal findings: Secondary | ICD-10-CM | POA: Diagnosis not present

## 2020-07-22 DIAGNOSIS — E785 Hyperlipidemia, unspecified: Secondary | ICD-10-CM | POA: Diagnosis not present

## 2020-07-23 LAB — CBC
HCT: 44.8 % (ref 38.5–50.0)
Hemoglobin: 15.2 g/dL (ref 13.2–17.1)
MCH: 29.5 pg (ref 27.0–33.0)
MCHC: 33.9 g/dL (ref 32.0–36.0)
MCV: 87 fL (ref 80.0–100.0)
MPV: 13 fL — ABNORMAL HIGH (ref 7.5–12.5)
Platelets: 192 10*3/uL (ref 140–400)
RBC: 5.15 10*6/uL (ref 4.20–5.80)
RDW: 13.1 % (ref 11.0–15.0)
WBC: 5.4 10*3/uL (ref 3.8–10.8)

## 2020-07-23 LAB — COMPLETE METABOLIC PANEL WITH GFR
AG Ratio: 1.8 (calc) (ref 1.0–2.5)
ALT: 18 U/L (ref 9–46)
AST: 22 U/L (ref 10–35)
Albumin: 4.4 g/dL (ref 3.6–5.1)
Alkaline phosphatase (APISO): 65 U/L (ref 35–144)
BUN: 19 mg/dL (ref 7–25)
CO2: 29 mmol/L (ref 20–32)
Calcium: 9.6 mg/dL (ref 8.6–10.3)
Chloride: 103 mmol/L (ref 98–110)
Creat: 1 mg/dL (ref 0.70–1.25)
GFR, Est African American: 90 mL/min/{1.73_m2} (ref >=60)
GFR, Est Non African American: 78 mL/min/{1.73_m2} (ref >=60)
Globulin: 2.4 g/dL (calc) (ref 1.9–3.7)
Glucose, Bld: 98 mg/dL (ref 65–99)
Potassium: 4.4 mmol/L (ref 3.5–5.3)
Sodium: 140 mmol/L (ref 135–146)
Total Bilirubin: 0.4 mg/dL (ref 0.2–1.2)
Total Protein: 6.8 g/dL (ref 6.1–8.1)

## 2020-07-23 LAB — LIPID PANEL
Cholesterol: 203 mg/dL — ABNORMAL HIGH (ref <200)
HDL: 48 mg/dL (ref >=40)
LDL Cholesterol (Calc): 137 mg/dL (calc) — ABNORMAL HIGH
Non-HDL Cholesterol (Calc): 155 mg/dL (calc) — ABNORMAL HIGH (ref <130)
Total CHOL/HDL Ratio: 4.2 (calc) (ref <5.0)
Triglycerides: 84 mg/dL (ref <150)

## 2020-07-23 LAB — PSA: PSA: 2.23 ng/mL (ref <=4.0)

## 2020-07-25 ENCOUNTER — Encounter: Payer: Self-pay | Admitting: Family Medicine

## 2020-07-26 MED ORDER — ATORVASTATIN CALCIUM 40 MG PO TABS: 40.0000 mg | ORAL_TABLET | Freq: Every evening | ORAL | 3 refills | Status: DC

## 2020-08-14 ENCOUNTER — Encounter: Payer: Self-pay | Admitting: Family Medicine

## 2020-08-16 NOTE — Telephone Encounter (Signed)
HI Dominica Severin, I don't mind at all.  Just find out if it has to have specific wording, etc. We don't have prescription pads anymore but print on watermarked prescription paper.

## 2020-08-17 NOTE — Telephone Encounter (Signed)
Note started in patients chart

## 2020-08-19 NOTE — Telephone Encounter (Signed)
Already contacted patient for pick up

## 2020-08-25 ENCOUNTER — Other Ambulatory Visit: Payer: Self-pay | Admitting: Family Medicine

## 2020-08-25 DIAGNOSIS — I10 Essential (primary) hypertension: Secondary | ICD-10-CM

## 2020-08-26 ENCOUNTER — Encounter: Payer: Self-pay | Admitting: Family Medicine

## 2020-11-15 ENCOUNTER — Encounter: Payer: Self-pay | Admitting: Family Medicine

## 2020-11-15 ENCOUNTER — Ambulatory Visit (INDEPENDENT_AMBULATORY_CARE_PROVIDER_SITE_OTHER): Payer: Medicare Other | Admitting: Family Medicine

## 2020-11-15 ENCOUNTER — Other Ambulatory Visit: Payer: Self-pay

## 2020-11-15 VITALS — BP 135/68 | HR 63 | Ht 76.0 in | Wt 226.0 lb

## 2020-11-15 DIAGNOSIS — E785 Hyperlipidemia, unspecified: Secondary | ICD-10-CM

## 2020-11-15 DIAGNOSIS — R9439 Abnormal result of other cardiovascular function study: Secondary | ICD-10-CM

## 2020-11-15 DIAGNOSIS — R7301 Impaired fasting glucose: Secondary | ICD-10-CM | POA: Diagnosis not present

## 2020-11-15 DIAGNOSIS — R0789 Other chest pain: Secondary | ICD-10-CM | POA: Diagnosis not present

## 2020-11-15 DIAGNOSIS — R7309 Other abnormal glucose: Secondary | ICD-10-CM

## 2020-11-15 DIAGNOSIS — I10 Essential (primary) hypertension: Secondary | ICD-10-CM

## 2020-11-15 NOTE — Assessment & Plan Note (Signed)
Plan to recheck hemoglobin A1c.  Simply started a low-carb diet 3 weeks ago.

## 2020-11-15 NOTE — Assessment & Plan Note (Signed)
BP borderline today. Will monitor.

## 2020-11-15 NOTE — Progress Notes (Signed)
Established Patient Office Visit  Subjective:  Patient ID: Glenn Meyer, male    DOB: [birth year]  Age: 70 y.o. MRN: [id number]  CC:  Chief Complaint  Patient presents with  . Shoulder Pain  . Chest Pain    HPI [name] presents for 2-3 weeks noticed some left upper ant CP. Was shoveling snow around that time.   happening couple of times a week. No pain with exercise.  Pain is random and is short liver.  NO SOB, no palps.  No pain with range of motion in the shoulder.  He says he has had about 6 episodes in about the last 2 to 3 weeks so it does not necessarily occur daily he describes it as a sharp shooting pain that starts towards the anterior shoulder and shoots down to the left mid chest towards his heart.  Again it usually just lasts seconds each time.  Today it occurred with walking after he got out of his vehicle.  No other recent changes to medications though he did start a statin about 4 months ago.  Past Medical History:  Diagnosis Date  . Hyperlipidemia   . Hypertension   . Ringing of ears    chronic X 20 years  . Tinnitus   . TMJ (dislocation of temporomandibular joint)     Past Surgical History:  Procedure Laterality Date  . TONSILECTOMY, ADENOIDECTOMY, BILATERAL MYRINGOTOMY AND TUBES    . TONSILLECTOMY     age 77    Family History  Problem Relation Age of Onset  . Hypertension Mother   . Hyperlipidemia Mother   . Kidney disease Mother        congenital/diaylisis 25's  . Heart disease Father        bypass 2000  . Hypertension Other   . Hyperlipidemia Other     Social History   Socioeconomic History  . Marital status: Married    Spouse name: [name]  . Number of children: 3  . Years of education: 16  . Highest education level: Bachelor's degree (e.g., BA, AB, BS)  Occupational History  . Occupation: Librarian, academic    Comment: retired  Tobacco Use  . Smoking status: Former Smoker    Years: 3.00    Types: Cigarettes    Quit date: 10/22/1972     Years since quitting: 48.0  . Smokeless tobacco: Never Used  . Tobacco comment: quit 34 years ago  Vaping Use  . Vaping Use: Never used  Substance and Sexual Activity  . Alcohol use: Yes    Alcohol/week: 7.0 standard drinks    Types: 7 Standard drinks or equivalent per week    Comment: per week  . Drug use: No  . 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 Not on file  Social History Narrative   Retired and takes care of the household duties and yardwork   Social Determinants of Radio broadcast assistant Strain: Not on file  Food Insecurity: Not on file  Transportation Needs: Not on file  Physical Activity: Not on file  Stress: Not on file  Social Connections: Not on file  Intimate Partner Violence: Not on file    Outpatient Medications Prior to Visit  Medication Sig Dispense Refill  . amLODipine (NORVASC) 5 MG tablet Take 1 tablet by mouth once daily 90 tablet 3  . atorvastatin (LIPITOR) 40 MG tablet Take 1 tablet (  40 mg total) by mouth at bedtime. 90 tablet 3  . Cholecalciferol (D3-1000 PO) Take 1 tablet by mouth daily.    Marland Kitchen esomeprazole (NEXIUM) 40 MG capsule Take 40 mg by mouth daily at 12 noon.    Marland Kitchen GLUCOSAMINE SULFATE PO Take 1,000 mg by mouth daily.    . Misc Natural Products (LUTEIN 20 PO) Take 25 mg by mouth.    . Multiple Vitamin (MULTIVITAMIN) capsule Take 1 capsule by mouth daily.    . Omega-3 Fatty Acids (FISH OIL) 1000 MG CAPS Take by mouth.    . pyridOXINE (VITAMIN B-6) 100 MG tablet Take 100 mg by mouth daily.    . tadalafil (CIALIS) 5 MG tablet Take 1 tablet (5 mg total) by mouth daily as needed for erectile dysfunction. 90 tablet 3  . Ascorbic Acid (VITAMIN C) 1000 MG tablet Take 1,000 mg by mouth daily.    . metFORMIN (GLUCOPHAGE) 500 MG tablet Take 1 tablet (500 mg total) by mouth 2 (two) times daily with a meal. 60 tablet 1   No facility-administered medications  prior to visit.    Allergies  Allergen Reactions  . Lisinopril-Hydrochlorothiazide     REACTION: Cough  . Meperidine Hcl     REACTION: nausea    ROS Review of Systems    Objective:    Physical Exam Constitutional:      Appearance: He is well-developed and well-nourished.  HENT:     Head: Normocephalic and atraumatic.  Neck:     Comments: No carotid bruits.   Cardiovascular:     Rate and Rhythm: Normal rate and regular rhythm.     Heart sounds: Normal heart sounds.  Pulmonary:     Effort: Pulmonary effort is normal.     Breath sounds: Normal breath sounds.  Musculoskeletal:     Comments: Cervical spine with normal range of motion.  Shoulders with normal range of motion nontender over the shoulder joint itself.  Some mild tenderness over that upper anterior chest just below the shoulder.  Skin:    General: Skin is warm and dry.  Neurological:     Mental Status: He is alert and oriented to person, place, and time.  Psychiatric:        Mood and Affect: Mood and affect normal.        Behavior: Behavior normal.     BP 135/68   Pulse 63   Ht 6\' 4"  (1.93 m)   Wt 226 lb (102.5 kg)   SpO2 98%   BMI 27.51 kg/m  Wt Readings from Last 3 Encounters:  11/15/20 226 lb (102.5 kg)  07/19/20 225 lb (102.1 kg)  09/06/19 224 lb 12 oz (101.9 kg)     Health Maintenance Due  Topic Date Due  . COLONOSCOPY (Pts 45-62yrs Insurance coverage will need to be confirmed)  08/30/2017  . COVID-19 Vaccine (3 - Pfizer risk 4-dose series) 01/26/2020    There are no preventive care reminders to display for this patient.  Lab Results  Component Value Date   TSH 1.38 08/30/2017   Lab Results  Component Value Date   WBC 5.4 07/22/2020   HGB 15.2 07/22/2020   HCT 44.8 07/22/2020   MCV 87.0 07/22/2020   PLT 192 07/22/2020   Lab Results  Component Value Date   NA 140 07/22/2020   K 4.4 07/22/2020   CO2 29 07/22/2020   GLUCOSE 98 07/22/2020   BUN 19 07/22/2020   CREATININE 1.00  07/22/2020   BILITOT 0.4  07/22/2020   ALKPHOS 56 08/03/2015   AST 22 07/22/2020   ALT 18 07/22/2020   PROT 6.8 07/22/2020   ALBUMIN 4.1 08/03/2015   CALCIUM 9.6 07/22/2020   Lab Results  Component Value Date   CHOL 203 (H) 07/22/2020   Lab Results  Component Value Date   HDL 48 07/22/2020   Lab Results  Component Value Date   LDLCALC 137 (H) 07/22/2020   Lab Results  Component Value Date   TRIG 84 07/22/2020   Lab Results  Component Value Date   CHOLHDL 4.2 07/22/2020   Lab Results  Component Value Date   HGBA1C 5.7 (A) 07/30/2018      Assessment & Plan:   Problem List Items Addressed This Visit      Cardiovascular and Mediastinum   HYPERTENSION, BENIGN ESSENTIAL    BP borderline today. Will monitor.        Relevant Orders   CBC   COMPLETE METABOLIC PANEL WITH GFR   TSH   Exercise Tolerance Test   Cardiac Stress Test: Informed Consent Details: Physician/Practitioner Attestation; Transcribe to consent form and obtain patient signature     Endocrine   IFG (impaired fasting glucose)    Plan to recheck hemoglobin A1c.  Simply started a low-carb diet 3 weeks ago.        Other   Hyperlipidemia   Relevant Orders   Lipid panel   Exercise Tolerance Test   Cardiac Stress Test: Informed Consent Details: Physician/Practitioner Attestation; Transcribe to consent form and obtain patient signature    Other Visit Diagnoses    Atypical chest pain    -  Primary   Relevant Orders   CBC   COMPLETE METABOLIC PANEL WITH GFR   Lipid panel   TSH   Testosterone Total,Free,Bio, Males   Hemoglobin A1c   CK (Creatine Kinase)   Exercise Tolerance Test   Cardiac Stress Test: Informed Consent Details: Physician/Practitioner Attestation; Transcribe to consent form and obtain patient signature   Abnormal glucose       Relevant Orders   Hemoglobin A1c      Aypical chest pain-it does not quite fit a cardiac picture it somewhat sounds musculoskeletal though its not  exacerbated by lifting or exercising which is a little unusual it just seems like it is an intermittent random sharp pain that literally lasts seconds and then resolves its not even occurring daily.  We will start by doing some labs just to rule out anemia, electrolyte disturbance, thyroid disorder etc.  If everything is normal then plan to get a exercise treadmill stress test for further work-up.  He is very concerned about his heart.  Overall low risk though he does have hypertension and hyperlipidemia and prediabetes.  EKG shows rate of 55 bpm, normal sinus rhythm with bradycardia.  No acute ST-T wave changes.  No orders of the defined types were placed in this encounter.   Follow-up: Return if symptoms worsen or fail to improve.    Beatrice Lecher, MD

## 2020-11-16 ENCOUNTER — Telehealth: Payer: Self-pay

## 2020-11-16 DIAGNOSIS — R7309 Other abnormal glucose: Secondary | ICD-10-CM | POA: Diagnosis not present

## 2020-11-16 DIAGNOSIS — R0789 Other chest pain: Secondary | ICD-10-CM | POA: Diagnosis not present

## 2020-11-16 DIAGNOSIS — E785 Hyperlipidemia, unspecified: Secondary | ICD-10-CM | POA: Diagnosis not present

## 2020-11-16 DIAGNOSIS — I10 Essential (primary) hypertension: Secondary | ICD-10-CM | POA: Diagnosis not present

## 2020-11-16 LAB — COMPLETE METABOLIC PANEL WITH GFR
AST: 25 U/L (ref 10–35)
Creat: 1.08 mg/dL (ref 0.70–1.25)
GFR, Est Non African American: 70 mL/min/{1.73_m2} (ref >=60)
Globulin: 2.4 g/dL (calc) (ref 1.9–3.7)
Total Bilirubin: 0.5 mg/dL (ref 0.2–1.2)

## 2020-11-16 LAB — CBC
MCHC: 33.8 g/dL (ref 32.0–36.0)
MPV: 12.9 fL — ABNORMAL HIGH (ref 7.5–12.5)
Platelets: 159 10*3/uL (ref 140–400)
WBC: 4.3 10*3/uL (ref 3.8–10.8)

## 2020-11-16 LAB — LIPID PANEL
Cholesterol: 134 mg/dL (ref <200)
LDL Cholesterol (Calc): 74 mg/dL (calc)
Triglycerides: 88 mg/dL (ref <150)

## 2020-11-16 LAB — TESTOSTERONE TOTAL,FREE,BIO, MALES
Albumin: 4.5 g/dL (ref 3.6–5.1)
Testosterone, Free: 49.6 pg/mL (ref 46.0–224.0)
Testosterone: 280 ng/dL (ref 250–827)

## 2020-11-16 NOTE — Telephone Encounter (Signed)
Chevez states the place he was referred to for the stress test is requiring him to get a Covid test on the Friday before and then quarantine for 4 days prior to appointment. He is ok with getting tested but doesn't want to quarantine for 4 days, all weekend. He wanted to know if there is another place he could go.

## 2020-11-17 LAB — CBC
HCT: 44.4 % (ref 38.5–50.0)
Hemoglobin: 15 g/dL (ref 13.2–17.1)
MCH: 29.4 pg (ref 27.0–33.0)
MCV: 87.1 fL (ref 80.0–100.0)
RBC: 5.1 10*6/uL (ref 4.20–5.80)
RDW: 13.4 % (ref 11.0–15.0)

## 2020-11-17 LAB — COMPLETE METABOLIC PANEL WITH GFR
AG Ratio: 1.9 (calc) (ref 1.0–2.5)
ALT: 25 U/L (ref 9–46)
Albumin: 4.5 g/dL (ref 3.6–5.1)
Alkaline phosphatase (APISO): 60 U/L (ref 35–144)
BUN: 23 mg/dL (ref 7–25)
CO2: 30 mmol/L (ref 20–32)
Calcium: 9.8 mg/dL (ref 8.6–10.3)
Chloride: 104 mmol/L (ref 98–110)
GFR, Est African American: 81 mL/min/{1.73_m2} (ref >=60)
Glucose, Bld: 108 mg/dL — ABNORMAL HIGH (ref 65–99)
Potassium: 5.1 mmol/L (ref 3.5–5.3)
Sodium: 141 mmol/L (ref 135–146)
Total Protein: 6.9 g/dL (ref 6.1–8.1)

## 2020-11-17 LAB — TESTOSTERONE TOTAL,FREE,BIO, MALES
Sex Hormone Binding: 21 nmol/L — ABNORMAL LOW (ref 22–77)
Testosterone, Bioavailable: 102.1 ng/dL — ABNORMAL LOW (ref >=110.0)

## 2020-11-17 LAB — LIPID PANEL
HDL: 43 mg/dL (ref >=40)
Non-HDL Cholesterol (Calc): 91 mg/dL (calc) (ref <130)
Total CHOL/HDL Ratio: 3.1 (calc) (ref <5.0)

## 2020-11-17 LAB — HEMOGLOBIN A1C
Hgb A1c MFr Bld: 5.9 % of total Hgb — ABNORMAL HIGH (ref <5.7)
Mean Plasma Glucose: 123 mg/dL
eAG (mmol/L): 6.8 mmol/L

## 2020-11-17 LAB — CK: Total CK: 188 U/L (ref 44–196)

## 2020-11-17 LAB — TSH: TSH: 2.04 mIU/L (ref 0.40–4.50)

## 2020-11-17 NOTE — Telephone Encounter (Signed)
He states he is going to stick with [place].

## 2020-11-17 NOTE — Telephone Encounter (Signed)
I guess we could see if [facility] does it differently or not.  The reason they are probably doing this is because he will be technically exercising and breathing heavily and so they just want to make sure that the tech who is administering the test is not exposed to someone who is actively sick.

## 2020-11-17 NOTE — Addendum Note (Signed)
Addended by: Narda Rutherford on: 11/17/2020 08:57 AM   Modules accepted: Orders

## 2020-11-18 ENCOUNTER — Encounter: Payer: Self-pay | Admitting: Family Medicine

## 2020-11-18 ENCOUNTER — Telehealth (HOSPITAL_COMMUNITY): Payer: Self-pay | Admitting: *Deleted

## 2020-11-18 ENCOUNTER — Other Ambulatory Visit (HOSPITAL_COMMUNITY)
Admission: RE | Admit: 2020-11-18 | Discharge: 2020-11-18 | Disposition: A | Discharge status: Home / Self Care | Payer: Medicare Other | Source: Ambulatory Visit | Attending: Family Medicine | Admitting: Family Medicine

## 2020-11-18 DIAGNOSIS — Z20822 Contact with and (suspected) exposure to covid-19: Secondary | ICD-10-CM | POA: Insufficient documentation

## 2020-11-18 DIAGNOSIS — Z01812 Encounter for preprocedural laboratory examination: Secondary | ICD-10-CM | POA: Insufficient documentation

## 2020-11-18 LAB — SARS CORONAVIRUS 2 (TAT 6-24 HRS): SARS Coronavirus 2: NEGATIVE

## 2020-11-18 NOTE — Telephone Encounter (Signed)
Needs appt, as that is a long converstaion. Not urgent.

## 2020-11-18 NOTE — Telephone Encounter (Signed)
It is good bc he is on statin. We can try a different one or have him try taking everyother day to see if difference in muscle stiffness

## 2020-11-18 NOTE — Telephone Encounter (Signed)
Close encounter 

## 2020-11-22 ENCOUNTER — Other Ambulatory Visit: Payer: Self-pay

## 2020-11-22 ENCOUNTER — Ambulatory Visit (HOSPITAL_COMMUNITY)
Admission: RE | Admit: 2020-11-22 | Discharge: 2020-11-22 | Disposition: A | Discharge status: Home / Self Care | Payer: Medicare HMO | Source: Ambulatory Visit | Attending: Cardiovascular Disease | Admitting: Cardiovascular Disease

## 2020-11-22 DIAGNOSIS — I10 Essential (primary) hypertension: Secondary | ICD-10-CM | POA: Insufficient documentation

## 2020-11-22 DIAGNOSIS — R0789 Other chest pain: Secondary | ICD-10-CM | POA: Insufficient documentation

## 2020-11-22 DIAGNOSIS — E785 Hyperlipidemia, unspecified: Secondary | ICD-10-CM | POA: Diagnosis not present

## 2020-11-22 LAB — EXERCISE TOLERANCE TEST
Estimated workload: 10.8 METS
Exercise duration (min): 9 min
Exercise duration (sec): 26 s
MPHR: 152 {beats}/min
Peak HR: 155 {beats}/min
Percent HR: 101 %
Rest HR: 57 {beats}/min

## 2020-11-23 ENCOUNTER — Encounter: Payer: Self-pay | Admitting: Family Medicine

## 2020-11-23 DIAGNOSIS — S0300XA Dislocation of jaw, unspecified side, initial encounter: Secondary | ICD-10-CM | POA: Insufficient documentation

## 2020-11-23 DIAGNOSIS — I10 Essential (primary) hypertension: Secondary | ICD-10-CM | POA: Insufficient documentation

## 2020-11-23 DIAGNOSIS — H9319 Tinnitus, unspecified ear: Secondary | ICD-10-CM | POA: Insufficient documentation

## 2020-11-23 NOTE — Addendum Note (Signed)
Addended by: Beatrice Lecher D on: 11/23/2020 03:32 PM   Modules accepted: Orders

## 2020-11-23 NOTE — Progress Notes (Signed)
Cardiology Office Note:    Date:  11/24/2020   ID:  Glenn Meyer, DOB 09-19-1951, MRN [id number]  PCP:  Hali Marry, MD  Cardiologist:  Shirlee More, MD   Referring MD: Hali Marry, *  ASSESSMENT:    1. Chest pain of uncertain etiology   2. HYPERTENSION, BENIGN ESSENTIAL   3. Hyperlipidemia, unspecified hyperlipidemia type   4. PVC's (premature ventricular contractions)    PLAN:    In order of problems listed above:  1. He is at increased cardiovascular risk with age hypertension and hyper lipidemia.  His cardiac symptoms are not typical angina.  He has an abnormal treadmill EKG response but does not have high risk markers of hypotension diminished exercise tolerance or chest pain.  We reviewed the options of further evaluation either direct referral to coronary angiography or cardiac CTA facilitated in the next 2 weeks I think it is appropriate for cardiac CTA he agrees but I will draw high-sensitivity troponin today if his elevated I would favor direct referral to coronary angiography.  He will start low-dose aspirin continue his calcium channel blocker statin I was going to start him on a beta-blocker but his resting heart rate is 60. 2. Stable blood pressure controlled 3. Continue his high intensity statin 4. Asymptomatic his PVCs occurred at peak exercise not in recovery at this time I would not start a beta-blocker the resting heart rate is 60 or consider an antiarrhythmic drug without knowledge of his coronary anatomy  Next appointment 6 weeks   Medication Adjustments/Labs and Tests Ordered: Current medicines are reviewed at length with the patient today.  Concerns regarding medicines are outlined above.  No orders of the defined types were placed in this encounter.  Meds ordered this encounter  Medications  . aspirin EC 81 MG tablet    Sig: Take 1 tablet (81 mg total) by mouth daily. Swallow whole.    Dispense:  90 tablet    Refill:  3      Referred after abnormal treadmill EKG  History of Present Illness:    Glenn Meyer is a 70 y.o. male with a history of hypertension and hyper lipidemia who is being seen today for the evaluation of chest pain at the request of Hali Marry, *.  He had a treadmill stress EKG performed yesterday showing normal exercise tolerance hypertensive blood pressure response frequent PVCs and what was interpreted as an ischemic ST segment response. 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 His previous lipid profile with elevated LDL 137 and cholesterol 203 non-HDL cholesterol elevated at 155 Most recent LDL cholesterol 74 non-HDL cholesterol 91.  He has a past history of a shoulder injury on the right side and surgery. He shoveled snow and ice and afterwards he had a discomfort in the area of the left shoulder that reminded him of his previous shoulder injury it lasted for about a day little radiation into the left chest it was not pressure or discomfort it was not severe and was not exertional on relieved with rest no shortness of breath not pleuritic no nausea vomiting or diaphoresis.  He was concerned about the risk of heart disease contact his PCP was seen immediately and referred for treadmill stress test yesterday.  He has had no further chest discomfort  I independently reviewed his treadmill stress test he has ST depression most leads its obstructing and I do see horizontal and downsloping  in lead V6 at peak exercise.  He had no chest pain and no awareness of PVCs. Past Medical History:  Diagnosis Date  . Hyperlipidemia   . Hypertension   . Ringing of ears    chronic X 20 years  . Tinnitus   . TMJ (dislocation of temporomandibular joint)     Past Surgical History:  Procedure Laterality Date  . TONSILECTOMY, ADENOIDECTOMY, BILATERAL MYRINGOTOMY AND TUBES    . TONSILLECTOMY     age 10    Current  Medications: Current Meds  Medication Sig  . amLODipine (NORVASC) 5 MG tablet Take 1 tablet by mouth once daily  . aspirin EC 81 MG tablet Take 1 tablet (81 mg total) by mouth daily. Swallow whole.  Marland Kitchen atorvastatin (LIPITOR) 40 MG tablet Take 1 tablet (40 mg total) by mouth at bedtime. (Patient taking differently: Take 40 mg by mouth every other day.)  . Cholecalciferol (D3-1000 PO) Take 1 tablet by mouth daily.  Marland Kitchen esomeprazole (NEXIUM) 40 MG capsule Take 40 mg by mouth daily at 12 noon.  Marland Kitchen GLUCOSAMINE SULFATE PO Take 1,000 mg by mouth daily.  . Misc Natural Products (LUTEIN 20 PO) Take 25 mg by mouth.  . Multiple Vitamin (MULTIVITAMIN) capsule Take 1 capsule by mouth daily.  . Omega-3 Fatty Acids (FISH OIL) 1000 MG CAPS Take by mouth.  . pyridOXINE (VITAMIN B-6) 100 MG tablet Take 100 mg by mouth daily.  . tadalafil (CIALIS) 5 MG tablet Take 1 tablet (5 mg total) by mouth daily as needed for erectile dysfunction.     Allergies:   Lisinopril-hydrochlorothiazide and Meperidine hcl   Social History   Socioeconomic History  . Marital status: Married    Spouse name: [name]  . Number of children: 3  . Years of education: 16  . Highest education level: Bachelor's degree (e.g., BA, AB, BS)  Occupational History  . Occupation: Librarian, academic    Comment: retired  Tobacco Use  . Smoking status: Former Smoker    Years: 3.00    Types: Cigarettes    Quit date: 10/22/1972    Years since quitting: 48.1  . Smokeless tobacco: Never Used  . Tobacco comment: quit 34 years ago  Vaping Use  . Vaping Use: Never used  Substance and Sexual Activity  . Alcohol use: Yes    Alcohol/week: 7.0 standard drinks    Types: 7 Standard drinks or equivalent per week    Comment: per week  . Drug use: No  . 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  ROS:   ROS Please see the history of present illness.     All other systems reviewed and are negative.  EKGs/Labs/Other Studies Reviewed:    The following studies were reviewed today:   EKG:  EKG today showed poor R wave progression consider old  anterior septal MI Recent Labs: 11/16/2020: ALT 25; BUN 23; Creat 1.08; Hemoglobin 15.0; Platelets 159; Potassium 5.1; Sodium 141; TSH 2.04  Recent Lipid Panel    Component Value Date/Time   CHOL 134 11/16/2020 0719   TRIG 88 11/16/2020 0719   HDL 43 11/16/2020 0719   CHOLHDL 3.1 11/16/2020 0719   VLDL 20 08/03/2015 0802   LDLCALC 74 11/16/2020 0719    Physical Exam:    VS:  BP 128/68   Pulse 60   Ht 6\' 4"  (1.93 m)   Wt 225 lb (102.1 kg)   SpO2 98%   BMI 27.39 kg/m     Wt Readings from Last 3 Encounters:  11/24/20 225 lb (102.1 kg)  11/15/20 226 lb (102.5 kg)  07/19/20 225 lb (102.1 kg)     GEN:  Well nourished, well developed in no acute distress HEENT: Normal NECK: No JVD; No carotid bruits LYMPHATICS: No lymphadenopathy CARDIAC: RRR, no murmurs, rubs, gallops RESPIRATORY:  Clear to auscultation without rales, wheezing or rhonchi  ABDOMEN: Soft, non-tender, non-distended MUSCULOSKELETAL:  No edema; No deformity  SKIN: Warm and dry NEUROLOGIC:  Alert and oriented x 3 PSYCHIATRIC:  Normal affect     Signed, Shirlee More, MD  11/24/2020 11:05 AM    [place]

## 2020-11-24 ENCOUNTER — Other Ambulatory Visit: Payer: Self-pay

## 2020-11-24 ENCOUNTER — Encounter: Payer: Self-pay | Admitting: Cardiology

## 2020-11-24 ENCOUNTER — Telehealth: Payer: Self-pay

## 2020-11-24 ENCOUNTER — Ambulatory Visit: Payer: Medicare HMO | Admitting: Cardiology

## 2020-11-24 VITALS — BP 128/68 | HR 60 | Ht 76.0 in | Wt 225.0 lb

## 2020-11-24 DIAGNOSIS — I493 Ventricular premature depolarization: Secondary | ICD-10-CM | POA: Diagnosis not present

## 2020-11-24 DIAGNOSIS — R079 Chest pain, unspecified: Secondary | ICD-10-CM | POA: Diagnosis not present

## 2020-11-24 DIAGNOSIS — E785 Hyperlipidemia, unspecified: Secondary | ICD-10-CM

## 2020-11-24 DIAGNOSIS — I10 Essential (primary) hypertension: Secondary | ICD-10-CM | POA: Diagnosis not present

## 2020-11-24 LAB — BASIC METABOLIC PANEL
BUN/Creatinine Ratio: 22 (ref 10–24)
BUN: 21 mg/dL (ref 8–27)
CO2: 25 mmol/L (ref 20–29)
Calcium: 10 mg/dL (ref 8.6–10.2)
Chloride: 103 mmol/L (ref 96–106)
Creatinine, Ser: 0.96 mg/dL (ref 0.76–1.27)
GFR calc Af Amer: 94 mL/min/{1.73_m2} (ref >59)
GFR calc non Af Amer: 81 mL/min/{1.73_m2} (ref >59)
Glucose: 109 mg/dL — ABNORMAL HIGH (ref 65–99)
Potassium: 4.5 mmol/L (ref 3.5–5.2)
Sodium: 139 mmol/L (ref 134–144)

## 2020-11-24 LAB — TROPONIN T: Troponin T (Highly Sensitive): 15 ng/L (ref 0–22)

## 2020-11-24 MED ORDER — METOPROLOL TARTRATE 100 MG PO TABS: 100.0000 mg | ORAL_TABLET | Freq: Once | ORAL | 0 refills | Status: DC

## 2020-11-24 MED ORDER — ASPIRIN EC 81 MG PO TBEC: 81.0000 mg | DELAYED_RELEASE_TABLET | Freq: Every day | ORAL | 3 refills | Status: DC

## 2020-11-24 NOTE — Telephone Encounter (Signed)
Spoke with patient regarding results and recommendation.  Patient verbalizes understanding and is agreeable to plan of care. Advised patient to call back with any issues or concerns.  

## 2020-11-24 NOTE — Telephone Encounter (Signed)
-----   Message from Richardo Priest, MD sent at 11/24/2020  3:16 PM EST ----- His high-sensitivity troponin is normal, we will plan on doing cardiac CTA as an outpatient.

## 2020-11-24 NOTE — Patient Instructions (Signed)
Medication Instructions:  Your physician has recommended you make the following change in your medication:  START: Aspirin 81 mg take one tablet by mouth daily.  *If you need a refill on your cardiac medications before your next appointment, please call your pharmacy*   Lab Work: Your physician recommends that you return for lab work in: TODAY Troponin, BMP If you have labs (blood work) drawn today and your tests are completely normal, you will receive your results only by: Marland Kitchen MyChart Message (if you have MyChart) OR . A paper copy in the mail If you have any lab test that is abnormal or we need to change your treatment, we will call you to review the results.   Testing/Procedures: Your cardiac CT will be scheduled at the below location:   [facility] [address] [place], [place] [address] [phone]  If scheduled at [facility], please arrive at the [facility] main entrance of [facility] 30 minutes prior to test start time. Proceed to the [facility] Radiology Department (first floor) to check-in and test prep.  Please follow these instructions carefully (unless otherwise directed):  Hold all erectile dysfunction medications at least 3 days (72 hrs) prior to test.  On the Night Before the Test: . Be sure to Drink plenty of water. . Do not consume any caffeinated/decaffeinated beverages or chocolate 12 hours prior to your test. . Do not take any antihistamines 12 hours prior to your test.  On the Day of the Test: . Drink plenty of water. Do not drink any water within one hour of the test. . Do not eat any food 4 hours prior to the test. . You may take your regular medications prior to the test.  . Take metoprolol (Lopressor) two hours prior to test.       After the Test: . Drink plenty of water. . After receiving IV contrast, you may experience a mild flushed feeling. This is normal. . On occasion, you may experience a mild rash up to 24  hours after the test. This is not dangerous. If this occurs, you can take Benadryl 25 mg and increase your fluid intake. . If you experience trouble breathing, this can be serious. If it is severe call 911 IMMEDIATELY. If it is mild, please call our office. . If you take any of these medications: Glipizide/Metformin, Avandament, Glucavance, please do not take 48 hours after completing test unless otherwise instructed.   Once we have confirmed authorization from your insurance company, we will call you to set up a date and time for your test. Based on how quickly your insurance processes prior authorizations requests, please allow up to 4 weeks to be contacted for scheduling your Cardiac CT appointment. Be advised that routine Cardiac CT appointments could be scheduled as many as 8 weeks after your provider has ordered it.  For non-scheduling related questions, please contact the cardiac imaging nurse navigator should you have any questions/concerns: Marchia Bond, Cardiac Imaging Nurse Navigator Burley Saver, Interim Cardiac Imaging Nurse [place] [facility] Direct Office Dial: [phone]   For scheduling needs, including cancellations and rescheduling, please call [place], [phone].     Follow-Up: At [facility], you and your health needs are our priority.  As part of our continuing mission to provide you with exceptional heart care, we have created designated Provider Care Teams.  These Care Teams include your primary Cardiologist (physician) and Advanced Practice Providers (APPs -  Physician  Assistants and Nurse Practitioners) who all work together to provide you with the care you need, when you need it.  We recommend signing up for the patient portal called "MyChart".  Sign up information is provided on this After Visit Summary.  MyChart is used to connect with patients for Virtual Visits (Telemedicine).  Patients are able to view lab/test results,  encounter notes, upcoming appointments, etc.  Non-urgent messages can be sent to your provider as well.   To learn more about what you can do with MyChart, go to [email or web address].    Your next appointment:   6 week(s)  The format for your next appointment:   In Person  Provider:   Shirlee More, MD   Other Instructions

## 2020-11-28 ENCOUNTER — Telehealth (HOSPITAL_COMMUNITY): Payer: Self-pay | Admitting: Emergency Medicine

## 2020-11-28 ENCOUNTER — Ambulatory Visit: Payer: Medicare Other | Admitting: Family Medicine

## 2020-11-28 NOTE — Telephone Encounter (Signed)
Reaching out to patient to offer assistance regarding upcoming cardiac imaging study; pt verbalizes understanding of appt date/time, parking situation and where to check in, pre-test NPO status and medications ordered, and verified current allergies; name and call back number provided for further questions should they arise Glenn Bond RN Navigator Cardiac Imaging [facility] [phone] office [phone] cell  pts resting HRs 63, 62, 60; suggested patient only take 50mg  metoprolol tartrate rather than full 100mg  dose. Pt verbalized understanding, avoiding cialis PTA Gerren Hoffmeier

## 2020-11-29 ENCOUNTER — Encounter (HOSPITAL_COMMUNITY): Payer: Self-pay

## 2020-11-29 ENCOUNTER — Other Ambulatory Visit: Payer: Self-pay

## 2020-11-29 ENCOUNTER — Ambulatory Visit (HOSPITAL_COMMUNITY)
Admission: RE | Admit: 2020-11-29 | Discharge: 2020-11-29 | Disposition: A | Discharge status: Home / Self Care | Payer: Medicare HMO | Source: Ambulatory Visit | Attending: Cardiology | Admitting: Cardiology

## 2020-11-29 DIAGNOSIS — R079 Chest pain, unspecified: Secondary | ICD-10-CM | POA: Diagnosis not present

## 2020-11-29 DIAGNOSIS — Q245 Malformation of coronary vessels: Secondary | ICD-10-CM | POA: Insufficient documentation

## 2020-11-29 DIAGNOSIS — I2510 Atherosclerotic heart disease of native coronary artery without angina pectoris: Secondary | ICD-10-CM | POA: Diagnosis not present

## 2020-11-29 MED ORDER — NITROGLYCERIN 0.4 MG SL SUBL
SUBLINGUAL_TABLET | SUBLINGUAL | Status: AC
  Filled 2020-11-29: qty 2

## 2020-11-29 MED ORDER — IOHEXOL 350 MG/ML SOLN
80.0000 mL | Freq: Once | INTRAVENOUS | Status: AC | PRN
  Administered 2020-11-29: 80 mL via INTRAVENOUS

## 2020-11-29 MED ORDER — NITROGLYCERIN 0.4 MG SL SUBL
0.8000 mg | SUBLINGUAL_TABLET | Freq: Once | SUBLINGUAL | Status: AC
  Administered 2020-11-29: 0.8 mg via SUBLINGUAL

## 2020-12-22 ENCOUNTER — Ambulatory Visit: Payer: Medicare HMO | Admitting: Family Medicine

## 2020-12-29 ENCOUNTER — Encounter: Payer: Self-pay | Admitting: Family Medicine

## 2020-12-29 ENCOUNTER — Ambulatory Visit (INDEPENDENT_AMBULATORY_CARE_PROVIDER_SITE_OTHER): Payer: Medicare HMO | Admitting: Family Medicine

## 2020-12-29 ENCOUNTER — Other Ambulatory Visit: Payer: Self-pay

## 2020-12-29 VITALS — BP 113/59 | HR 57 | Ht 76.0 in | Wt 223.0 lb

## 2020-12-29 DIAGNOSIS — R7989 Other specified abnormal findings of blood chemistry: Secondary | ICD-10-CM | POA: Diagnosis not present

## 2020-12-29 DIAGNOSIS — E291 Testicular hypofunction: Secondary | ICD-10-CM | POA: Insufficient documentation

## 2020-12-29 NOTE — Assessment & Plan Note (Signed)
We reviewed lab results and discussed that it is important to get a second reading to confirm diagnosis of hypogonadism.  If it does come back showing that his levels are consistently low then we discussed actual treatment.  He did go online and looked at some of the symptoms for low testosterone and feels like he had almost all of them.  He says he falls asleep very easily during the day he said it in fact he takes some is a 20-minute nap almost every day and about 8:00 at night he is just exhausted he is ready to go to bed he is hoping that addressing the testosterone level will help with some of his energy levels his feels like his mood is okay.  He does stay active he goes to the gym.  He takes care of their home animals he is actually now retired.  We discussed potential side effects and risks of being hormone therapy.  Did encourage him to speak with his cardiologist with whom he is meeting in about 2 weeks just to make sure that there is no contraindication I did warn about a potential increased risk for cardiovascular disease.  We also discussed the importance of monitoring blood work including PSA and CBC as well as accurately testing the testosterone level so that we do not cause suppression of his natural production of testosterone.  He is interested in potential topical treatment versus an injection.  He will let me know after he speaks with a cardiologist and we can move forward at that point in time.

## 2020-12-29 NOTE — Progress Notes (Signed)
Established Patient Office Visit  Subjective:  Patient ID: Glenn Meyer, male    DOB: Jul 12, 1951  Age: 70 y.o. MRN: [id number]  CC:  Chief Complaint  Patient presents with  . Results    Discuss testosterone results     HPI Glenn Meyer presents for follow-up low testosterone levels.  Testosterone was 200 which was on the very low end of normal and bioavailable testosterone was 102 which was low as well.  Hormone was also borderline low at 49.  He is here to go over those results today.  Past Medical History:  Diagnosis Date  . Hyperlipidemia   . Hypertension   . Ringing of ears    chronic X 20 years  . Tinnitus   . TMJ (dislocation of temporomandibular joint)     Past Surgical History:  Procedure Laterality Date  . TONSILECTOMY, ADENOIDECTOMY, BILATERAL MYRINGOTOMY AND TUBES    . TONSILLECTOMY     age 24    Family History  Problem Relation Age of Onset  . Hypertension Mother   . Hyperlipidemia Mother   . Kidney disease Mother        congenital/diaylisis 84's  . Heart disease Father        bypass 2000  . Hypertension Other   . Hyperlipidemia Other     Social History   Socioeconomic History  . Marital status: Married    Spouse name: [name]  . Number of children: 3  . Years of education: 16  . Highest education level: Bachelor's degree (e.g., BA, AB, BS)  Occupational History  . Occupation: Librarian, academic    Comment: retired  Tobacco Use  . Smoking status: Former Smoker    Years: 3.00    Types: Cigarettes    Quit date: 10/22/1972    Years since quitting: 48.2  . Smokeless tobacco: Never Used  . Tobacco comment: quit 34 years ago  Vaping Use  . Vaping Use: Never used  Substance and Sexual Activity  . Alcohol use: Yes    Alcohol/week: 7.0 standard drinks    Types: 7 Standard drinks or equivalent per week    Comment: per week  . Drug use: No  . 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 Not on file  Social History Narrative   Retired and takes care of the household duties and yardwork   Social Determinants of Radio broadcast assistant Strain: Not on file  Food Insecurity: Not on file  Transportation Needs: Not on file  Physical Activity: Not on file  Stress: Not on file  Social Connections: Not on file  Intimate Partner Violence: Not on file    Outpatient Medications Prior to Visit  Medication Sig Dispense Refill  . amLODipine (NORVASC) 5 MG tablet Take 1 tablet by mouth once daily 90 tablet 3  . aspirin EC 81 MG tablet Take 1 tablet (81 mg total) by mouth daily. Swallow whole. 90 tablet 3  . atorvastatin (LIPITOR) 40 MG tablet Take 1 tablet (40 mg total) by mouth at bedtime. (Patient taking differently: Take 40 mg by mouth every other day.) 90 tablet 3  . Cholecalciferol (D3-1000 PO) Take 1 tablet by mouth daily.    Marland Kitchen esomeprazole (NEXIUM) 40 MG capsule Take 40 mg by mouth daily at 12 noon.    Marland Kitchen GLUCOSAMINE SULFATE PO Take 1,000 mg by mouth daily.    . Misc Natural  Products (LUTEIN 20 PO) Take 25 mg by mouth.    . Multiple Vitamin (MULTIVITAMIN) capsule Take 1 capsule by mouth daily.    . Omega-3 Fatty Acids (FISH OIL) 1000 MG CAPS Take by mouth.    . pyridOXINE (VITAMIN B-6) 100 MG tablet Take 100 mg by mouth daily.    . tadalafil (CIALIS) 5 MG tablet Take 1 tablet (5 mg total) by mouth daily as needed for erectile dysfunction. 90 tablet 3  . metoprolol tartrate (LOPRESSOR) 100 MG tablet Take 1 tablet (100 mg total) by mouth once for 1 dose. Take two hours prior to your cardiac CT 1 tablet 0   No facility-administered medications prior to visit.    Allergies  Allergen Reactions  . Lisinopril-Hydrochlorothiazide     REACTION: Cough  . Meperidine Hcl     REACTION: nausea    ROS Review of Systems    Objective:    Physical Exam Vitals reviewed.  Constitutional:      Appearance: He is well-developed.  HENT:     Head:  Normocephalic and atraumatic.  Eyes:     Conjunctiva/sclera: Conjunctivae normal.  Cardiovascular:     Rate and Rhythm: Normal rate.  Pulmonary:     Effort: Pulmonary effort is normal.  Skin:    General: Skin is dry.     Coloration: Skin is not pale.  Neurological:     Mental Status: He is alert and oriented to person, place, and time.  Psychiatric:        Behavior: Behavior normal.     BP (!) 113/59   Pulse (!) 57   Ht 6\' 4"  (1.93 m)   Wt 223 lb (101.2 kg)   SpO2 100%   BMI 27.14 kg/m  Wt Readings from Last 3 Encounters:  12/29/20 223 lb (101.2 kg)  11/24/20 225 lb (102.1 kg)  11/15/20 226 lb (102.5 kg)     Health Maintenance Due  Topic Date Due  . COLONOSCOPY (Pts 45-5yrs Insurance coverage will need to be confirmed)  08/30/2017    There are no preventive care reminders to display for this patient.  Lab Results  Component Value Date   TSH 2.04 11/16/2020   Lab Results  Component Value Date   WBC 4.3 11/16/2020   HGB 15.0 11/16/2020   HCT 44.4 11/16/2020   MCV 87.1 11/16/2020   PLT 159 11/16/2020   Lab Results  Component Value Date   NA 139 11/24/2020   K 4.5 11/24/2020   CO2 25 11/24/2020   GLUCOSE 109 (H) 11/24/2020   BUN 21 11/24/2020   CREATININE 0.96 11/24/2020   BILITOT 0.5 11/16/2020   ALKPHOS 56 08/03/2015   AST 25 11/16/2020   ALT 25 11/16/2020   PROT 6.9 11/16/2020   ALBUMIN 4.1 08/03/2015   CALCIUM 10.0 11/24/2020   Lab Results  Component Value Date   CHOL 134 11/16/2020   Lab Results  Component Value Date   HDL 43 11/16/2020   Lab Results  Component Value Date   LDLCALC 74 11/16/2020   Lab Results  Component Value Date   TRIG 88 11/16/2020   Lab Results  Component Value Date   CHOLHDL 3.1 11/16/2020   Lab Results  Component Value Date   HGBA1C 5.9 (H) 11/16/2020      Assessment & Plan:   Problem List Items Addressed This Visit      Other   Low testosterone - Primary    We reviewed lab results and discussed  that  it is important to get a second reading to confirm diagnosis of hypogonadism.  If it does come back showing that his levels are consistently low then we discussed actual treatment.  He did go online and looked at some of the symptoms for low testosterone and feels like he had almost all of them.  He says he falls asleep very easily during the day he said it in fact he takes some is a 20-minute nap almost every day and about 8:00 at night he is just exhausted he is ready to go to bed he is hoping that addressing the testosterone level will help with some of his energy levels his feels like his mood is okay.  He does stay active he goes to the gym.  He takes care of their home animals he is actually now retired.  We discussed potential side effects and risks of being hormone therapy.  Did encourage him to speak with his cardiologist with whom he is meeting in about 2 weeks just to make sure that there is no contraindication I did warn about a potential increased risk for cardiovascular disease.  We also discussed the importance of monitoring blood work including PSA and CBC as well as accurately testing the testosterone level so that we do not cause suppression of his natural production of testosterone.  He is interested in potential topical treatment versus an injection.  He will let me know after he speaks with a cardiologist and we can move forward at that point in time.      Relevant Orders   Testosterone Total,Free,Bio, Males   Prolactin   Follicle stimulating hormone   Luteinizing hormone      No orders of the defined types were placed in this encounter.   Follow-up: No follow-ups on file.    Beatrice Lecher, MD

## 2020-12-30 ENCOUNTER — Encounter: Payer: Self-pay | Admitting: Family Medicine

## 2020-12-30 LAB — TESTOSTERONE TOTAL,FREE,BIO, MALES
Albumin: 4.4 g/dL (ref 3.6–5.1)
Sex Hormone Binding: 21 nmol/L — ABNORMAL LOW (ref 22–77)
Testosterone: 217 ng/dL — ABNORMAL LOW (ref 250–827)

## 2020-12-30 LAB — LUTEINIZING HORMONE: LH: 1.4 m[IU]/mL — ABNORMAL LOW (ref 1.6–15.2)

## 2020-12-30 LAB — PROLACTIN: Prolactin: 12.2 ng/mL (ref 2.0–18.0)

## 2020-12-30 LAB — FOLLICLE STIMULATING HORMONE: FSH: 5.2 m[IU]/mL (ref 1.6–8.0)

## 2021-01-02 ENCOUNTER — Encounter: Payer: Self-pay | Admitting: Family Medicine

## 2021-01-02 DIAGNOSIS — R7989 Other specified abnormal findings of blood chemistry: Secondary | ICD-10-CM

## 2021-01-03 NOTE — Telephone Encounter (Signed)
See other MyChart message

## 2021-01-04 MED ORDER — TESTOSTERONE 12.5 MG/ACT (1%) TD GEL
1.0000 | Freq: Every day | TRANSDERMAL | 3 refills | Status: DC
Start: 2021-01-04 — End: 2021-01-05

## 2021-01-04 NOTE — Telephone Encounter (Signed)
Med sent. If not covered can try a dif formulation

## 2021-01-05 ENCOUNTER — Telehealth: Payer: Self-pay

## 2021-01-05 DIAGNOSIS — R7989 Other specified abnormal findings of blood chemistry: Secondary | ICD-10-CM

## 2021-01-05 MED ORDER — TESTOSTERONE 20.25 MG/ACT (1.62%) TD GEL: 1.0000 | Freq: Every morning | TRANSDERMAL | 1 refills | Status: DC

## 2021-01-05 NOTE — Telephone Encounter (Signed)
[facility] called and left a message stating the testosterone gel is not covered by insurance. Please advise.

## 2021-01-05 NOTE — Telephone Encounter (Signed)
New one sent.  If this were not covered then we may need to contact the pharmacy to make sure that it does not need a prior authorization versus just not being covered at all.

## 2021-01-06 ENCOUNTER — Telehealth: Payer: Self-pay

## 2021-01-06 NOTE — Telephone Encounter (Signed)
PA started on this medication.

## 2021-01-06 NOTE — Telephone Encounter (Signed)
PA sent for Testosterone 20.25 MG/ACT (1.62%) GEL

## 2021-01-09 NOTE — Progress Notes (Signed)
Cardiology Office Note:    Date:  01/10/2021   ID:  Glenn Meyer, DOB April 22, 1951, MRN [id number]  PCP:  Hali Marry, MD  Cardiologist:  Shirlee More, MD    Referring MD: Hali Marry, *    ASSESSMENT:    1. Mild CAD   2. HYPERTENSION, BENIGN ESSENTIAL   3. Hyperlipidemia, unspecified hyperlipidemia type   4. PVC's (premature ventricular contractions)    PLAN:    In order of problems listed above:  1. Fortunately cardiac CTA shows a mid range calcium score and mild nonobstructive CAD.  He is quite reassured that his cardiovascular prognosis is good compliant with his treatment aspirin antihypertensive high intensity statin and was given a prescription for nitroglycerin to take if needed. 2. BP at target continue current treatment 3. Continue his high intensity statin with mid range calcium score mild nonobstructive CAD 4. Stable no clinical recurrence at this time   Next appointment: 1 year   Medication Adjustments/Labs and Tests Ordered: Current medicines are reviewed at length with the patient today.  Concerns regarding medicines are outlined above.  No orders of the defined types were placed in this encounter.  Meds ordered this encounter  Medications  . nitroGLYCERIN (NITROSTAT) 0.4 MG SL tablet    Sig: Place 1 tablet (0.4 mg total) under the tongue every 5 (five) minutes as needed for chest pain.    Dispense:  25 tablet    Refill:  3    Chief Complaint  Patient presents with  . Follow-up  . Chest Pain    After cardiac CTA    History of Present Illness:    Glenn Meyer is a 70 y.o. male with a hx of hypertension hyperlipidemia PVCs and chest pain last seen 11/24/2020 he had a high-sensitivity troponin drawn at that time which was not elevated and he subsequently underwent cardiac CTA reported 11/30/2020 which showed a calcium score of 98 45th percentile for age and sex.  CAD was present 46 to 49% in the left anterior descending coronary  artery myocardial bridging less than 25% plaque left main coronary artery. Compliance with diet, lifestyle and medications: Yes  He has good healthcare knowledge have reviewed the results of his cardiac CTA with him he is quite reassured and has had no further cardiovascular symptoms palpitation chest pain or shortness of breath.  He will continue aspirin antihypertensive lipid-lowering and I gave him a prescription for nitroglycerin to have if needed Past Medical History:  Diagnosis Date  . Hyperlipidemia   . Hypertension   . Ringing of ears    chronic X 20 years  . Tinnitus   . TMJ (dislocation of temporomandibular joint)     Past Surgical History:  Procedure Laterality Date  . TONSILECTOMY, ADENOIDECTOMY, BILATERAL MYRINGOTOMY AND TUBES    . TONSILLECTOMY     age 70    Current Medications: Current Meds  Medication Sig  . amLODipine (NORVASC) 5 MG tablet Take 1 tablet by mouth once daily  . aspirin EC 81 MG tablet Take 1 tablet (81 mg total) by mouth daily. Swallow whole.  Marland Kitchen atorvastatin (LIPITOR) 40 MG tablet Take 1 tablet (40 mg total) by mouth at bedtime. (Patient taking differently: Take 40 mg by mouth every other day.)  . Cholecalciferol (D3-1000 PO) Take 1 tablet by mouth daily.  Marland Kitchen esomeprazole (NEXIUM) 40 MG capsule Take 40 mg by mouth daily at 12 noon.  Marland Kitchen GLUCOSAMINE SULFATE PO Take 1,000 mg by mouth daily.  Marland Kitchen  Misc Natural Products (LUTEIN 20 PO) Take 25 mg by mouth.  . Multiple Vitamin (MULTIVITAMIN) capsule Take 1 capsule by mouth daily.  . nitroGLYCERIN (NITROSTAT) 0.4 MG SL tablet Place 1 tablet (0.4 mg total) under the tongue every 5 (five) minutes as needed for chest pain.  . Omega-3 Fatty Acids (FISH OIL) 1000 MG CAPS Take by mouth.  . pyridOXINE (VITAMIN B-6) 100 MG tablet Take 100 mg by mouth daily.  . tadalafil (CIALIS) 5 MG tablet Take 1 tablet (5 mg total) by mouth daily as needed for erectile dysfunction.  . Testosterone 20.25 MG/ACT (1.62%) GEL Place 1 Pump  onto the skin in the morning. To each upper outer arm     Allergies:   Lisinopril-hydrochlorothiazide and Meperidine hcl   Social History   Socioeconomic History  . Marital status: Married    Spouse name: Glenn Meyer  . Number of children: 3  . Years of education: 16  . Highest education level: Bachelor's degree (e.g., BA, AB, BS)  Occupational History  . Occupation: Librarian, academic    Comment: retired  Tobacco Use  . Smoking status: Former Smoker    Years: 3.00    Types: Cigarettes    Quit date: 10/22/1972    Years since quitting: 48.2  . Smokeless tobacco: Never Used  . Tobacco comment: quit 34 years ago  Vaping Use  . Vaping Use: Never used  Substance and Sexual Activity  . Alcohol use: Yes    Alcohol/week: 7.0 standard drinks    Types: 7 Standard drinks or equivalent per week    Comment: per week  . Drug use: No  . 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 ROS:   Please see the history of present illness.    All other systems reviewed and are negative.  EKGs/Labs/Other Studies Reviewed:    The following studies were reviewed today:    Recent Labs: 11/16/2020: ALT 25; Hemoglobin 15.0; Platelets 159; TSH 2.04 11/24/2020: BUN 21; Creatinine, Ser 0.96; Potassium 4.5; Sodium 139  Recent Lipid Panel    Component Value Date/Time   CHOL  134 11/16/2020 0719   TRIG 88 11/16/2020 0719   HDL 43 11/16/2020 0719   CHOLHDL 3.1 11/16/2020 0719   VLDL 20 08/03/2015 0802   LDLCALC 74 11/16/2020 0719    Physical Exam:    VS:  BP 132/68 (BP Location: Right Arm, Patient Position: Sitting, Cuff Size: Normal)   Pulse 60   Ht 6\' 4"  (1.93 m)   Wt 226 lb 12.8 oz (102.9 kg)   SpO2 98%   BMI 27.61 kg/m     Wt Readings from Last 3 Encounters:  01/10/21 226 lb 12.8 oz (102.9 kg)  12/29/20 223 lb (101.2 kg)  11/24/20 225 lb (102.1 kg)     GEN:  Well nourished, well developed in no  acute distress HEENT: Normal NECK: No JVD; No carotid bruits LYMPHATICS: No lymphadenopathy CARDIAC: RRR, no murmurs, rubs, gallops RESPIRATORY:  Clear to auscultation without rales, wheezing or rhonchi  ABDOMEN: Soft, non-tender, non-distended MUSCULOSKELETAL:  No edema; No deformity  SKIN: Warm and dry NEUROLOGIC:  Alert and oriented x 3 PSYCHIATRIC:  Normal affect    Signed, Shirlee More, MD  01/10/2021 2:41 PM    [facility]

## 2021-01-09 NOTE — Telephone Encounter (Signed)
Patient advised that medication prior authorization was approved.

## 2021-01-10 ENCOUNTER — Ambulatory Visit: Payer: Medicare HMO | Admitting: Cardiology

## 2021-01-10 ENCOUNTER — Other Ambulatory Visit: Payer: Self-pay

## 2021-01-10 ENCOUNTER — Encounter: Payer: Self-pay | Admitting: Cardiology

## 2021-01-10 VITALS — BP 132/68 | HR 60 | Ht 76.0 in | Wt 226.8 lb

## 2021-01-10 DIAGNOSIS — I10 Essential (primary) hypertension: Secondary | ICD-10-CM

## 2021-01-10 DIAGNOSIS — I2510 Atherosclerotic heart disease of native coronary artery without angina pectoris: Secondary | ICD-10-CM

## 2021-01-10 DIAGNOSIS — E785 Hyperlipidemia, unspecified: Secondary | ICD-10-CM | POA: Diagnosis not present

## 2021-01-10 DIAGNOSIS — I493 Ventricular premature depolarization: Secondary | ICD-10-CM | POA: Diagnosis not present

## 2021-01-10 DIAGNOSIS — Z0100 Encounter for examination of eyes and vision without abnormal findings: Secondary | ICD-10-CM | POA: Diagnosis not present

## 2021-01-10 DIAGNOSIS — H5203 Hypermetropia, bilateral: Secondary | ICD-10-CM | POA: Diagnosis not present

## 2021-01-10 DIAGNOSIS — H52209 Unspecified astigmatism, unspecified eye: Secondary | ICD-10-CM | POA: Diagnosis not present

## 2021-01-10 MED ORDER — NITROGLYCERIN 0.4 MG SL SUBL: 0.4000 mg | SUBLINGUAL_TABLET | SUBLINGUAL | 3 refills | Status: DC | PRN

## 2021-01-10 NOTE — Patient Instructions (Signed)
Medication Instructions:  Your physician has recommended you make the following change in your medication:  START: Nitroglycerin 0.4 mg take one tablet by mouth every 5 minutes up to three times as needed for chest pain.  *If you need a refill on your cardiac medications before your next appointment, please call your pharmacy*   Lab Work: None If you have labs (blood work) drawn today and your tests are completely normal, you will receive your results only by: MyChart Message (if you have MyChart) OR A paper copy in the mail If you have any lab test that is abnormal or we need to change your treatment, we will call you to review the results.   Testing/Procedures: None   Follow-Up: At [facility], you and your health needs are our priority.  As part of our continuing mission to provide you with exceptional heart care, we have created designated Provider Care Teams.  These Care Teams include your primary Cardiologist (physician) and Advanced Practice Providers (APPs -  Physician Assistants and Nurse Practitioners) who all work together to provide you with the care you need, when you need it.  We recommend signing up for the patient portal called "MyChart".  Sign up information is provided on this After Visit Summary.  MyChart is used to connect with patients for Virtual Visits (Telemedicine).  Patients are able to view lab/test results, encounter notes, upcoming appointments, etc.  Non-urgent messages can be sent to your provider as well.   To learn more about what you can do with MyChart, go to [email or web address].    Your next appointment:   1 year(s)  The format for your next appointment:   In Person  Provider:   Brian Munley, MD   Other Instructions   

## 2021-01-24 ENCOUNTER — Encounter: Payer: Self-pay | Admitting: Family Medicine

## 2021-01-27 ENCOUNTER — Encounter: Payer: Self-pay | Admitting: Family Medicine

## 2021-01-27 DIAGNOSIS — Z1211 Encounter for screening for malignant neoplasm of colon: Secondary | ICD-10-CM

## 2021-02-07 NOTE — Telephone Encounter (Signed)
He would be a great candidate doer Cologuard. They are good for 3 yr. OK to order.

## 2021-02-21 DIAGNOSIS — Z1211 Encounter for screening for malignant neoplasm of colon: Secondary | ICD-10-CM | POA: Diagnosis not present

## 2021-02-21 LAB — COLOGUARD: Cologuard: NEGATIVE

## 2021-03-01 LAB — EXTERNAL GENERIC LAB PROCEDURE: COLOGUARD: NEGATIVE

## 2021-03-01 LAB — COLOGUARD
COLOGUARD: NEGATIVE
Cologuard: NEGATIVE

## 2021-03-02 ENCOUNTER — Telehealth: Payer: Self-pay | Admitting: Family Medicine

## 2021-03-02 NOTE — Telephone Encounter (Signed)
Call pt: Cologuard result is negative.  Repeat in 3 yrs

## 2021-03-03 NOTE — Telephone Encounter (Signed)
Pt advised.

## 2021-03-07 ENCOUNTER — Telehealth: Payer: Self-pay | Admitting: Family Medicine

## 2021-03-07 NOTE — Progress Notes (Signed)
  Chronic Care Management   Note  03/07/2021 Name: MACALLAN ORD MRN: [id number] DOB: 08-02-1951  LENNIS KORB is a 69 y.o. year old male who is a primary care patient of Madilyn Fireman, Rene Kocher, MD. I reached out to Casimiro Needle by phone today in response to a referral sent by Mr. Jawanza Zambito Preziosi's PCP, Hali Marry, MD.   Mr. Ohair was given information about Chronic Care Management services today including:  1. CCM service includes personalized support from designated clinical staff supervised by his physician, including individualized plan of care and coordination with other care providers 2. 24/7 contact phone numbers for assistance for urgent and routine care needs. 3. Service will only be billed when office clinical staff spend 20 minutes or more in a month to coordinate care. 4. Only one practitioner may furnish and bill the service in a calendar month. 5. The patient may stop CCM services at any time (effective at the end of the month) by phone call to the office staff.   Patient did not agree to enrollment in care management services and does not wish to consider at this time.  Follow up plan:   Lauretta Grill [employer] Scheduler

## 2021-03-12 ENCOUNTER — Other Ambulatory Visit: Payer: Self-pay

## 2021-03-12 ENCOUNTER — Emergency Department
Admission: EM | Admit: 2021-03-12 | Discharge: 2021-03-12 | Disposition: A | Discharge status: Home / Self Care | Payer: Medicare HMO | Source: Home / Self Care

## 2021-03-12 DIAGNOSIS — M5416 Radiculopathy, lumbar region: Secondary | ICD-10-CM

## 2021-03-12 LAB — POCT URINALYSIS DIP (MANUAL ENTRY)
Bilirubin, UA: NEGATIVE
Blood, UA: NEGATIVE
Glucose, UA: NEGATIVE mg/dL
Ketones, POC UA: NEGATIVE mg/dL
Leukocytes, UA: NEGATIVE
Nitrite, UA: NEGATIVE
Protein Ur, POC: NEGATIVE mg/dL
Spec Grav, UA: 1.015 (ref 1.010–1.025)
Urobilinogen, UA: 0.2 E.U./dL
pH, UA: 6.5 (ref 5.0–8.0)

## 2021-03-12 MED ORDER — BACLOFEN 10 MG PO TABS: 5.0000 mg | ORAL_TABLET | Freq: Three times a day (TID) | ORAL | 0 refills | Status: DC

## 2021-03-12 MED ORDER — PREDNISONE 10 MG (21) PO TBPK: ORAL_TABLET | ORAL | 0 refills | Status: DC

## 2021-03-12 NOTE — ED Triage Notes (Signed)
Pt presents to Urgent Care with c/o lower back pain x 2 weeks and inner thigh pain x 1 day. States he took a muscle relaxant (wife's Rx) last night and pain improved. He's concerned about a bulging disc.

## 2021-03-12 NOTE — Discharge Instructions (Signed)
Take prednisone as prescribed to reduce inflammation and pain. Take baclofen (muscle relaxer) as prescribed as needed - may cause drowsiness. Continue heat and gentle stretching. Can also take Tylenol if needed for additional pain relief.  Follow up with PCP or orthopedics if no improvement after completing medications or if back problems become recurrent. Go to the ER if develop bowel or bladder incontinence, or numbness to the saddle area.

## 2021-03-12 NOTE — ED Provider Notes (Signed)
[facility]    CSN: [id number] Arrival date & time: 03/12/21  0823      History   Chief Complaint Chief Complaint  Patient presents with  . Back Pain  . Leg Pain    HPI Glenn Meyer is a 70 y.o. male.   Patient presents with concerns of low back and leg pain. The patient reports bilateral low back pain for the past 2 weeks or so. He denies any known injury or increased/changed activity before the back pain began. He states yesterday he developed dull throbbing aching pain in the bilateral inner thighs along with the continued back pain. He denies any numbness/tingling in his legs or the groin and denies any bowel or bladder changes or incontinence. He denies pain elsewhere in the legs. The patient states yesterday he took a leftover muscle relaxer his wife had from prior shoulder surgery and that seemed to resolve the leg pain and help his back pain somewhat. He denies history of back problems. He has also been trying OTC medications, heat, and stretching with minimal lasting relief. He states it bothers him most when sitting in a soft chair such as his recliner, less when standing and walking.   The history is provided by the patient.  Back Pain Location:  Lumbar spine Quality:  Aching and stiffness Relieved by:  Muscle relaxants Worsened by:  Sitting and movement Ineffective treatments:  Heating pad and OTC medications Associated symptoms: leg pain   Associated symptoms: no abdominal pain, no bladder incontinence, no bowel incontinence, no dysuria, no fever, no numbness, no paresthesias, no pelvic pain, no perianal numbness, no tingling and no weakness   Leg Pain Associated symptoms: back pain   Associated symptoms: no fatigue, no fever and no neck pain     Past Medical History:  Diagnosis Date  . Hyperlipidemia   . Hypertension   . Ringing of ears    chronic X 20 years  . Tinnitus   . TMJ (dislocation of temporomandibular joint)     Patient Active Problem  List   Diagnosis Date Noted  . Low testosterone 12/29/2020  . TMJ (dislocation of temporomandibular joint)   . Tinnitus   . Ringing of ears   . Hypertension   . BPH associated with nocturia 07/19/2020  . Abnormal weight gain 07/19/2020  . Patellofemoral arthritis of right knee 10/02/2018  . IFG (impaired fasting glucose) 07/30/2018  . Degenerative arthritis of left hip 10/03/2012  . SHOULDER PAIN, RIGHT 03/17/2008  . Hyperlipidemia 08/08/2007  . HYPERTENSION, BENIGN ESSENTIAL 07/30/2007  . GERD 07/30/2007    Past Surgical History:  Procedure Laterality Date  . TONSILECTOMY, ADENOIDECTOMY, BILATERAL MYRINGOTOMY AND TUBES    . TONSILLECTOMY     age 38       Home Medications    Prior to Admission medications   Medication Sig Start Date End Date Taking? Authorizing Provider  baclofen (LIORESAL) 10 MG tablet Take 0.5-1 tablets (5-10 mg total) by mouth 3 (three) times daily. May cause drowsiness 03/12/21  Yes Jomaira Darr L, PA  ibuprofen (ADVIL) 400 MG tablet Take 400 mg by mouth every 6 (six) hours as needed.   Yes [provider]  predniSONE (STERAPRED UNI-PAK 21 TAB) 10 MG (21) TBPK tablet Take as directed 03/12/21  Yes Sohrab Keelan L, PA  amLODipine (NORVASC) 5 MG tablet Take 1 tablet by mouth once daily 08/25/20   Hali Marry, MD  aspirin EC 81 MG tablet Take 1 tablet (81 mg  total) by mouth daily. Swallow whole. 11/24/20   Richardo Priest, MD  atorvastatin (LIPITOR) 40 MG tablet Take 1 tablet (40 mg total) by mouth at bedtime. Patient taking differently: Take 40 mg by mouth every other day. 07/26/20   Hali Marry, MD  Cholecalciferol (D3-1000 PO) Take 1 tablet by mouth daily.    [provider]  esomeprazole (NEXIUM) 40 MG capsule Take 40 mg by mouth daily at 12 noon.    [provider]  GLUCOSAMINE SULFATE PO Take 1,000 mg by mouth daily.    [provider]  Misc Natural Products (LUTEIN 20 PO) Take 25 mg by mouth.    [provider]  Multiple Vitamin (MULTIVITAMIN) capsule Take 1 capsule by mouth daily.    [provider]  nitroGLYCERIN (NITROSTAT) 0.4 MG SL tablet Place 1 tablet (0.4 mg total) under the tongue every 5 (five) minutes as needed for chest pain. 01/10/21 04/10/21  Richardo Priest, MD  Omega-3 Fatty Acids (FISH OIL) 1000 MG CAPS Take by mouth.    [provider]  pyridOXINE (VITAMIN B-6) 100 MG tablet Take 100 mg by mouth daily.    [provider]  tadalafil (CIALIS) 5 MG tablet Take 1 tablet (5 mg total) by mouth daily as needed for erectile dysfunction. 07/19/20   Hali Marry, MD  Testosterone 20.25 MG/ACT (1.62%) GEL Place 1 Pump onto the skin in the morning. To each upper outer arm 01/05/21   Hali Marry, MD    Family History Family History  Problem Relation Age of Onset  . Hypertension Mother   . Hyperlipidemia Mother   . Kidney disease Mother        congenital/diaylisis 65's  . Heart disease Father        bypass 2000  . Hypertension Other   . Hyperlipidemia Other     Social History Social History   Tobacco Use  . Smoking status: Former Smoker    Years: 3.00    Types: Cigarettes    Quit date: 10/22/1972    Years since quitting: 48.4  . Smokeless tobacco: Never Used  . Tobacco comment: quit 34 years ago  Vaping Use  . Vaping Use: Never used  Substance Use Topics  . Alcohol use: Yes    Alcohol/week: 2.0 standard drinks    Types: 2 Cans of beer per week    Comment: per week  . Drug use: No     Allergies   Lisinopril-hydrochlorothiazide and Meperidine hcl   Review of Systems Review of Systems  Constitutional: Negative for fatigue and fever.  Respiratory: Negative for shortness of breath.   Gastrointestinal: Negative for abdominal pain, bowel incontinence, constipation, diarrhea, nausea and vomiting.  Genitourinary: Negative for bladder incontinence, difficulty urinating, dysuria and pelvic pain.  Musculoskeletal: Positive  for back pain. Negative for neck pain.  Skin: Negative for rash.  Neurological: Negative for dizziness, tingling, weakness, numbness and paresthesias.     Physical Exam Triage Vital Signs ED Triage Vitals  Enc Vitals Group     BP 03/12/21 0842 131/80     Pulse Rate 03/12/21 0842 (!) 59     Resp 03/12/21 0842 18     Temp 03/12/21 0842 98.8 F (37.1 C)     Temp Source 03/12/21 0842 Oral     SpO2 03/12/21 0842 97 %     Weight 03/12/21 0838 225 lb (102.1 kg)     Height 03/12/21 0838 6' 3.5" (1.918 m)  Head Circumference --      Peak Flow --      Pain Score 03/12/21 0838 4     Pain Loc --      Pain Edu? --      Excl. in [place]? --    No data found.  Updated Vital Signs BP 131/80 (BP Location: Right Arm)   Pulse (!) 59   Temp 98.8 F (37.1 C) (Oral)   Resp 18   Ht 6' 3.5" (1.918 m)   Wt 225 lb (102.1 kg)   SpO2 97%   BMI 27.75 kg/m   Visual Acuity Right Eye Distance:   Left Eye Distance:   Bilateral Distance:    Right Eye Near:   Left Eye Near:    Bilateral Near:     Physical Exam Vitals and nursing note reviewed.  Constitutional:      General: He is not in acute distress. HENT:     Head: Normocephalic.  Eyes:     Pupils: Pupils are equal, round, and reactive to light.  Cardiovascular:     Rate and Rhythm: Normal rate and regular rhythm.     Heart sounds: Normal heart sounds.  Pulmonary:     Effort: Pulmonary effort is normal.     Breath sounds: Normal breath sounds.  Musculoskeletal:     Lumbar back: Tenderness present. No bony tenderness. Negative right straight leg raise test and negative left straight leg raise test.     Comments: Bilateral lumbar paraspinal tenderness and tightness. Negative bilateral SLR - pt reports yesterday when he was having the inner thigh pain the SLR movement would've caused worsening thigh pain.   Skin:    Findings: No rash.  Neurological:     Mental Status: He is alert.     Gait: Gait normal.  Psychiatric:        Mood  and Affect: Mood normal.      UC Treatments / Results  Labs (all labs ordered are listed, but only abnormal results are displayed) Labs Reviewed  POCT URINALYSIS DIP (MANUAL ENTRY)    EKG   Radiology No results found.  Procedures Procedures (including critical care time)  Medications Ordered in UC Medications - No data to display  Initial Impression / Assessment and Plan / UC Course  I have reviewed the triage vital signs and the nursing notes.  Pertinent labs & imaging results that were available during my care of the patient were reviewed by me and considered in my medical decision making (see chart for details).     Suspect L3 nerve irritation/compression causing inner thigh discomfort. No signs of cauda equina. Prednisone and muscle relaxer prn along with continued heat and stretching. F/u if worsening, ER precautions given.   E/M: 1 acute complicated illness, no data, moderate risk due to prescription management  Final Clinical Impressions(s) / UC Diagnoses   Final diagnoses:  Lumbar radiculopathy, acute     Discharge Instructions     Take prednisone as prescribed to reduce inflammation and pain. Take baclofen (muscle relaxer) as prescribed as needed - may cause drowsiness. Continue heat and gentle stretching. Can also take Tylenol if needed for additional pain relief.  Follow up with PCP or orthopedics if no improvement after completing medications or if back problems become recurrent. Go to the ER if develop bowel or bladder incontinence, or numbness to the saddle area.     ED Prescriptions    Medication Sig Dispense Auth. Provider   predniSONE (STERAPRED UNI-PAK 21  TAB) 10 MG (21) TBPK tablet Take as directed 1 each Abner Greenspan, Kilee Hedding L, PA   baclofen (LIORESAL) 10 MG tablet Take 0.5-1 tablets (5-10 mg total) by mouth 3 (three) times daily. May cause drowsiness 30 each Abner Greenspan, Noemi Bellissimo L, [place]     PDMP not reviewed this encounter.   Delsa Sale, [place] 03/12/21  [phone]

## 2021-03-22 DIAGNOSIS — M9906 Segmental and somatic dysfunction of lower extremity: Secondary | ICD-10-CM | POA: Diagnosis not present

## 2021-03-22 DIAGNOSIS — M542 Cervicalgia: Secondary | ICD-10-CM | POA: Diagnosis not present

## 2021-03-22 DIAGNOSIS — M546 Pain in thoracic spine: Secondary | ICD-10-CM | POA: Diagnosis not present

## 2021-03-22 DIAGNOSIS — M9903 Segmental and somatic dysfunction of lumbar region: Secondary | ICD-10-CM | POA: Diagnosis not present

## 2021-03-22 DIAGNOSIS — M9901 Segmental and somatic dysfunction of cervical region: Secondary | ICD-10-CM | POA: Diagnosis not present

## 2021-03-22 DIAGNOSIS — M25561 Pain in right knee: Secondary | ICD-10-CM | POA: Diagnosis not present

## 2021-03-22 DIAGNOSIS — M9902 Segmental and somatic dysfunction of thoracic region: Secondary | ICD-10-CM | POA: Diagnosis not present

## 2021-03-22 DIAGNOSIS — M5451 Vertebrogenic low back pain: Secondary | ICD-10-CM | POA: Diagnosis not present

## 2021-03-22 DIAGNOSIS — M9907 Segmental and somatic dysfunction of upper extremity: Secondary | ICD-10-CM | POA: Diagnosis not present

## 2021-03-23 ENCOUNTER — Encounter: Payer: Self-pay | Admitting: Family Medicine

## 2021-03-24 ENCOUNTER — Encounter: Payer: Self-pay | Admitting: Family Medicine

## 2021-03-24 DIAGNOSIS — M9903 Segmental and somatic dysfunction of lumbar region: Secondary | ICD-10-CM | POA: Diagnosis not present

## 2021-03-24 DIAGNOSIS — M9905 Segmental and somatic dysfunction of pelvic region: Secondary | ICD-10-CM | POA: Diagnosis not present

## 2021-03-24 DIAGNOSIS — M9904 Segmental and somatic dysfunction of sacral region: Secondary | ICD-10-CM | POA: Diagnosis not present

## 2021-03-24 DIAGNOSIS — M9902 Segmental and somatic dysfunction of thoracic region: Secondary | ICD-10-CM | POA: Diagnosis not present

## 2021-03-24 DIAGNOSIS — M461 Sacroiliitis, not elsewhere classified: Secondary | ICD-10-CM | POA: Diagnosis not present

## 2021-03-24 DIAGNOSIS — M9906 Segmental and somatic dysfunction of lower extremity: Secondary | ICD-10-CM | POA: Diagnosis not present

## 2021-03-24 DIAGNOSIS — M546 Pain in thoracic spine: Secondary | ICD-10-CM | POA: Diagnosis not present

## 2021-03-24 DIAGNOSIS — M5451 Vertebrogenic low back pain: Secondary | ICD-10-CM | POA: Diagnosis not present

## 2021-03-27 DIAGNOSIS — M9902 Segmental and somatic dysfunction of thoracic region: Secondary | ICD-10-CM | POA: Diagnosis not present

## 2021-03-27 DIAGNOSIS — M546 Pain in thoracic spine: Secondary | ICD-10-CM | POA: Diagnosis not present

## 2021-03-27 DIAGNOSIS — M9906 Segmental and somatic dysfunction of lower extremity: Secondary | ICD-10-CM | POA: Diagnosis not present

## 2021-03-27 DIAGNOSIS — M461 Sacroiliitis, not elsewhere classified: Secondary | ICD-10-CM | POA: Diagnosis not present

## 2021-03-27 DIAGNOSIS — M9904 Segmental and somatic dysfunction of sacral region: Secondary | ICD-10-CM | POA: Diagnosis not present

## 2021-03-27 DIAGNOSIS — M5451 Vertebrogenic low back pain: Secondary | ICD-10-CM | POA: Diagnosis not present

## 2021-03-27 DIAGNOSIS — M9903 Segmental and somatic dysfunction of lumbar region: Secondary | ICD-10-CM | POA: Diagnosis not present

## 2021-03-27 DIAGNOSIS — M9905 Segmental and somatic dysfunction of pelvic region: Secondary | ICD-10-CM | POA: Diagnosis not present

## 2021-03-29 DIAGNOSIS — M9905 Segmental and somatic dysfunction of pelvic region: Secondary | ICD-10-CM | POA: Diagnosis not present

## 2021-03-29 DIAGNOSIS — M9906 Segmental and somatic dysfunction of lower extremity: Secondary | ICD-10-CM | POA: Diagnosis not present

## 2021-03-29 DIAGNOSIS — M9902 Segmental and somatic dysfunction of thoracic region: Secondary | ICD-10-CM | POA: Diagnosis not present

## 2021-03-29 DIAGNOSIS — M546 Pain in thoracic spine: Secondary | ICD-10-CM | POA: Diagnosis not present

## 2021-03-29 DIAGNOSIS — M461 Sacroiliitis, not elsewhere classified: Secondary | ICD-10-CM | POA: Diagnosis not present

## 2021-03-29 DIAGNOSIS — M5451 Vertebrogenic low back pain: Secondary | ICD-10-CM | POA: Diagnosis not present

## 2021-03-29 DIAGNOSIS — M9904 Segmental and somatic dysfunction of sacral region: Secondary | ICD-10-CM | POA: Diagnosis not present

## 2021-03-29 DIAGNOSIS — M9903 Segmental and somatic dysfunction of lumbar region: Secondary | ICD-10-CM | POA: Diagnosis not present

## 2021-03-31 DIAGNOSIS — M5451 Vertebrogenic low back pain: Secondary | ICD-10-CM | POA: Diagnosis not present

## 2021-03-31 DIAGNOSIS — M9903 Segmental and somatic dysfunction of lumbar region: Secondary | ICD-10-CM | POA: Diagnosis not present

## 2021-03-31 DIAGNOSIS — M546 Pain in thoracic spine: Secondary | ICD-10-CM | POA: Diagnosis not present

## 2021-03-31 DIAGNOSIS — M9906 Segmental and somatic dysfunction of lower extremity: Secondary | ICD-10-CM | POA: Diagnosis not present

## 2021-03-31 DIAGNOSIS — M9905 Segmental and somatic dysfunction of pelvic region: Secondary | ICD-10-CM | POA: Diagnosis not present

## 2021-03-31 DIAGNOSIS — M9904 Segmental and somatic dysfunction of sacral region: Secondary | ICD-10-CM | POA: Diagnosis not present

## 2021-03-31 DIAGNOSIS — M461 Sacroiliitis, not elsewhere classified: Secondary | ICD-10-CM | POA: Diagnosis not present

## 2021-03-31 DIAGNOSIS — M9902 Segmental and somatic dysfunction of thoracic region: Secondary | ICD-10-CM | POA: Diagnosis not present

## 2021-04-07 ENCOUNTER — Encounter: Payer: Self-pay | Admitting: Family Medicine

## 2021-04-07 NOTE — Telephone Encounter (Signed)
Patient has been scheduled with Dr T . AM

## 2021-04-12 ENCOUNTER — Ambulatory Visit (INDEPENDENT_AMBULATORY_CARE_PROVIDER_SITE_OTHER): Payer: Medicare HMO

## 2021-04-12 ENCOUNTER — Ambulatory Visit: Payer: Self-pay

## 2021-04-12 ENCOUNTER — Ambulatory Visit (INDEPENDENT_AMBULATORY_CARE_PROVIDER_SITE_OTHER): Payer: Medicare HMO | Admitting: Sports Medicine

## 2021-04-12 ENCOUNTER — Other Ambulatory Visit: Payer: Self-pay

## 2021-04-12 DIAGNOSIS — M1612 Unilateral primary osteoarthritis, left hip: Secondary | ICD-10-CM | POA: Diagnosis not present

## 2021-04-12 DIAGNOSIS — M545 Low back pain, unspecified: Secondary | ICD-10-CM | POA: Diagnosis not present

## 2021-04-12 DIAGNOSIS — M533 Sacrococcygeal disorders, not elsewhere classified: Secondary | ICD-10-CM | POA: Diagnosis not present

## 2021-04-12 DIAGNOSIS — M51369 Other intervertebral disc degeneration, lumbar region without mention of lumbar back pain or lower extremity pain: Secondary | ICD-10-CM | POA: Insufficient documentation

## 2021-04-12 DIAGNOSIS — M160 Bilateral primary osteoarthritis of hip: Secondary | ICD-10-CM | POA: Diagnosis not present

## 2021-04-12 DIAGNOSIS — M1611 Unilateral primary osteoarthritis, right hip: Secondary | ICD-10-CM | POA: Diagnosis not present

## 2021-04-12 MED ORDER — IBUPROFEN 800 MG PO TABS: 800.0000 mg | ORAL_TABLET | Freq: Three times a day (TID) | ORAL | 2 refills | Status: DC | PRN

## 2021-04-12 NOTE — Progress Notes (Signed)
    Procedures performed today:    Procedure: Real-time Ultrasound Guided injection of the left sacroiliac joint Device: Samsung HS60  Verbal informed consent obtained.  Time-out conducted.  Noted no overlying erythema, induration, or other signs of local infection.  Skin prepped in a sterile fashion.  Local anesthesia: Topical Ethyl chloride.  With sterile technique and under real time ultrasound guidance: Noted arthritic joint, 1 cc Kenalog 40, 2 cc lidocaine, 2 cc bupivacaine injected easily Completed without difficulty  Advised to call if fevers/chills, erythema, induration, drainage, or persistent bleeding.  Images permanently stored and available for review in PACS.  Impression: Technically successful ultrasound guided injection.  Procedure: Real-time Ultrasound Guided injection of the left hip joint Device: Samsung HS60  Verbal informed consent obtained.  Time-out conducted.  Noted no overlying erythema, induration, or other signs of local infection.  Skin prepped in a sterile fashion.  Local anesthesia: Topical Ethyl chloride.  With sterile technique and under real time ultrasound guidance: Noted arthritic joint, 1 cc Kenalog 40, 2 cc lidocaine, 2 cc bupivacaine injected easily Completed without difficulty  Advised to call if fevers/chills, erythema, induration, drainage, or persistent bleeding.  Images permanently stored and available for review in PACS.  Impression: Technically successful ultrasound guided injection.  Independent interpretation of notes and tests performed by another provider:   None.  Brief History, Exam, Impression, and Recommendations:    Degenerative arthritis of left hip Glenn Meyer is a pleasant 70 year old male, he has known hip osteoarthritis, we treated this with meloxicam back in 2013 and he did well, now having recurrence of pain, reproduced with internal rotation, this is an exacerbation of a chronic process. He does have a drive down to [place] coming up, so we will treat him aggressively. X-rays, left hip joint injection, return to see me in 6 weeks.  Acute left-sided low back pain without sciatica Pain located at the left sacroiliac joint, worse in the mornings with stiffness, it does not really have a classic discogenic or facetogenic description, we will go ahead and inject his left sacroiliac joint, get some x-rays. Adding some prescription strength ibuprofen.   ___________________________________________ Gwen Her. Dianah Field, M.D., ABFM., CAQSM. Primary Care and [place] Instructor of [employer] [employer] [facility] [employer]

## 2021-04-12 NOTE — Assessment & Plan Note (Signed)
Glenn Meyer is a pleasant 70 year old male, he has known hip osteoarthritis, we treated this with meloxicam back in 2013 and he did well, now having recurrence of pain, reproduced with internal rotation, this is an exacerbation of a chronic process. He does have a drive down to [place] coming up, so we will treat him aggressively. X-rays, left hip joint injection, return to see me in 6 weeks.

## 2021-04-12 NOTE — Assessment & Plan Note (Signed)
Pain located at the left sacroiliac joint, worse in the mornings with stiffness, it does not really have a classic discogenic or facetogenic description, we will go ahead and inject his left sacroiliac joint, get some x-rays. Adding some prescription strength ibuprofen.

## 2021-05-06 DIAGNOSIS — M1612 Unilateral primary osteoarthritis, left hip: Secondary | ICD-10-CM

## 2021-05-17 ENCOUNTER — Ambulatory Visit: Payer: Medicare HMO | Admitting: Physical Therapy

## 2021-05-18 ENCOUNTER — Encounter: Payer: Self-pay | Admitting: Rehabilitative and Restorative Service Providers"

## 2021-05-18 ENCOUNTER — Ambulatory Visit (INDEPENDENT_AMBULATORY_CARE_PROVIDER_SITE_OTHER): Payer: Medicare HMO | Admitting: Rehabilitative and Restorative Service Providers"

## 2021-05-18 ENCOUNTER — Other Ambulatory Visit: Payer: Self-pay

## 2021-05-18 DIAGNOSIS — M25552 Pain in left hip: Secondary | ICD-10-CM

## 2021-05-18 DIAGNOSIS — M25651 Stiffness of right hip, not elsewhere classified: Secondary | ICD-10-CM | POA: Diagnosis not present

## 2021-05-18 DIAGNOSIS — R29898 Other symptoms and signs involving the musculoskeletal system: Secondary | ICD-10-CM

## 2021-05-18 DIAGNOSIS — M6281 Muscle weakness (generalized): Secondary | ICD-10-CM

## 2021-05-18 DIAGNOSIS — M25652 Stiffness of left hip, not elsewhere classified: Secondary | ICD-10-CM

## 2021-05-18 NOTE — Therapy (Addendum)
[facility] [place], [place], [address] Phone: [phone]   Fax:  [phone]  Physical Therapy Evaluation  Patient Details  Name: GWYN LEHNE MRN: [id number] Date of Birth: 24-Mar-1951 Referring Provider (PT): Dr Dianah Field   Encounter Date: 05/18/2021   PT End of Session - 05/18/21 1446     Visit Number 1    Number of Visits 12    Date for PT Re-Evaluation 06/29/21    PT Start Time 1645    PT Stop Time [id number]    PT Time Calculation (min) 50 min    Activity Tolerance Patient tolerated treatment well             Past Medical History:  Diagnosis Date   Hyperlipidemia    Hypertension    Ringing of ears    chronic X 20 years   Tinnitus    TMJ (dislocation of temporomandibular joint)     Past Surgical History:  Procedure Laterality Date   TONSILECTOMY, ADENOIDECTOMY, BILATERAL MYRINGOTOMY AND TUBES     TONSILLECTOMY     age 27    There were no vitals filed for this visit.    Subjective Assessment - 05/18/21 1450     Subjective Patient reports that he has been having Lt hip pain for the past 3 months. He was seen by MD and received injections on the lumbar spine and the Lt hip. LB continues to do well. He had some improvement in Lt hip pain ~ 2 weeks after the injection but symptoms returned. He now has nagging pain in the Lt hip as well as stiffness and difficulty  with functional activites. He sometimes awakens due to Lt hip pain.    Pertinent History LBP; HTN; arthritis    Patient Stated Goals decrease hip pain and increase flexibility in Lt hip    Currently in Pain? Yes    Pain Score 3     Pain Location Hip    Pain Orientation Left;Anterior;Posterior    Pain Descriptors / Indicators Aching;Nagging    Pain Type Acute pain    Pain Onset More than a month ago    Pain Frequency Intermittent    Aggravating Factors  prolonged standing >30 min; activities including uphill walking; mowing -  worse in the morning; worse with stairs in the morning                [facility] PT Assessment - 05/18/21 0001       Assessment   Medical Diagnosis Lt hip pain    Referring Provider (PT) Dr Dianah Field    Onset Date/Surgical Date 02/03/21    Hand Dominance Right    Next MD Visit 05/24/21    Prior Therapy none      Precautions   Precautions None      Restrictions   Weight Bearing Restrictions No      Balance Screen   Has the patient fallen in the past 6 months No    Has the patient had a decrease in activity level because of a fear of falling?  No    Is the patient reluctant to leave their home because of a fear of falling?  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      Observation/Other Assessments   Focus on Therapeutic Outcomes (FOTO)  62      Sensation   Additional Comments WFL's per pt report      Posture/Postural Control   Posture Comments head forward; shoudlers rounded; flexed forward at hips      AROM   Right/Left Hip --   tight Lt > Rt in all planes   Right Hip Flexion 115    Left Hip Flexion 98    Right/Left Knee --   WFL bilat   Right/Left Ankle --   tight into DF   Lumbar Flexion 60% pulling in LB and bilat HS's    Lumbar Extension 40%    Lumbar - Right Side Bend 65%    Lumbar - Left Side Bend 65%    Lumbar - Right Rotation 50%    Lumbar - Left Rotation 50%      Strength   Right Hip Flexion 4+/5    Right Hip Extension 4+/5    Right Hip ABduction 4/5    Left Hip Flexion 4/5    Left Hip Extension 4/5    Left Hip ABduction 4-/5      Flexibility   Hamstrings Rt 60 deg Lt 49 deg    Quadriceps Rt 117; Lt 124     ITB tight Lt >RT    Piriformis tight Lt >> Rt    Quadratus Lumborum tight bilat      Palpation   Spinal mobility hypomobile lumbar spine    Palpation comment sightness and tenderness Lt anterior hip through hip flexors; posterior hip through the posterior hip in gluts and piriformis      Balance   Balance Assessed --   SLS Rt 4 sec; Lt 6sec                       Objective measurements completed on examination: See above findings.       [facility] Adult PT Treatment/Exercise - 05/18/21 0001       Therapeutic Activites    Therapeutic Activities Other Therapeutic Activities    Other Therapeutic Activities myofacial ball release work standing and prone for hip flexors      Knee/Hip Exercises: Stretches   Passive Hamstring Stretch Left;2 reps;30 seconds   supine with strap   Quad Stretch Left;2 reps;30 seconds   prone with strap   Hip Flexor Stretch Left;2 reps;30 seconds    Hip Flexor Stretch Limitations supine thomas position; repeated sitting    ITB Stretch Limitations unable to achieve good position for stretch supine with strap    Piriformis Stretch Limitations piriformis stretch increased tightness and pain in the hip flexors                    PT Education - 05/18/21 1536     Education Details HEP    Person(s) Educated Patient    Methods Explanation;Demonstration;Tactile cues;Verbal cues;Handout    Comprehension Verbalized understanding;Returned demonstration;Verbal cues required;Tactile cues required                 PT Long Term Goals - 05/18/21 1559       PT LONG TERM GOAL #1   Title Increase Lt hip flexion by 5-10 deg with no pain with movement    Time 6    Period Weeks  Status New    Target Date 06/29/21      PT LONG TERM GOAL #2   Title Increasd strength bilat LE's by 1/2 to 1 muscle grade    Time 6    Period Weeks    Status New    Target Date 06/29/21      PT LONG TERM GOAL #3   Title Patient reports minimal pain no  greater than 1-2/10 for functional activities including walking and no pain awakening him during sleep    Time 6    Period Weeks    Status New    Target Date 06/29/21      PT LONG TERM GOAL #4   Title Independent in HEP including aquatic exercise program as indicated    Time 6    Period Weeks    Status New    Target Date 06/29/21      PT LONG TERM GOAL #5   Title Improve functional limitation score to 71    Time 6    Period Weeks    Status New    Target Date 06/29/21                    Plan - 05/18/21 1546     Clinical Impression Statement Patient presents with ~ 3 month history of Lt hip pain. He was treated with injection ~ 6 weeks ago with ~ 2 weeks of improvement. Symptoms have persisted and patient now has pain with prolonged standing; walking up inclines; when lying on the Lt side; with functionalactivities. He is aoso awakening during the night due to Lt hip pain. Patient has poor posture and alignment; limited trunk and Lt > Rt LE hip mobility; weakness Lt > Rt hip; muscular tightness through the posterior Lt hip and Lt hip flexors; Decreased balance demonstrated by decreased SLS Rt > Lt; pain limiting functional activities. Patient will benefit from PT to address problems identified.    Stability/Clinical Decision Making Stable/Uncomplicated    Clinical Decision Making Low    Rehab Potential Good    PT Frequency 2x / week    PT Duration 6 weeks    PT Treatment/Interventions ADLs/Self Care Home Management;Aquatic Therapy;Cryotherapy;Iontophoresis '4mg'$ /ml Dexamethasone;Moist Heat;Ultrasound;Gait training;Stair training;Patient/family education;Functional mobility training;Therapeutic activities;Therapeutic exercise;Balance training;Neuromuscular re-education;Manual techniques;Passive range of motion;Dry needling;Taping    PT Next Visit Plan review HEP; trial of DN Lt hip flexors and posterior hip gluts/piriformis; stretching Lt/Rt hip; progress with strengthening;  postural correction working on trunk extension(stretch for pecs); postural strengthening - push/pull sports cord; has TENs unit at home he will try for hip pain; modalities as indicated; suggestions/corrections for gym program; aquatic exercise program    PT Home Exercise Plan [email or web address]             Patient will benefit from skilled therapeutic intervention in order to improve the following deficits and impairments:  Decreased range of motion, Pain, Decreased activity tolerance, Decreased balance, Impaired flexibility, Improper body mechanics, Decreased mobility, Decreased strength, Postural dysfunction  Visit Diagnosis: Pain in left hip - Plan: PT plan of care cert/re-cert  Stiffness of both hip joints - Plan: PT plan of care cert/re-cert  Muscle weakness (generalized) - Plan: PT plan of care cert/re-cert  Other symptoms and signs involving the musculoskeletal system - Plan: PT plan of care cert/re-cert     Problem List Patient Active Problem List   Diagnosis Date Noted   Acute left-sided low back pain without sciatica 04/12/2021   Low testosterone 12/29/2020  TMJ (dislocation of temporomandibular joint)    Tinnitus    Ringing of ears    Hypertension    BPH associated with nocturia 07/19/2020   Abnormal weight gain 07/19/2020   Patellofemoral arthritis of right knee 10/02/2018   IFG (impaired fasting glucose) 07/30/2018   Degenerative arthritis of left hip 10/03/2012   SHOULDER PAIN, RIGHT 03/17/2008   Hyperlipidemia 08/08/2007   HYPERTENSION, BENIGN ESSENTIAL 07/30/2007   GERD 07/30/2007    Bellina Tokarczyk Nilda Simmer PT, MPH  05/18/2021, 4:05 PM  [facility] [place], [place], [address] Phone: [phone]   Fax:  [phone]  Name: CRUZ MINGUS MRN: [id number] Date of Birth: [birth year]

## 2021-05-18 NOTE — Patient Instructions (Addendum)
    Access Code: [email or web address] URL: [email or web address] Date: 05/18/2021 Prepared by: Gillermo Murdoch  Exercises Prone Quadriceps Stretch with Strap - 2 x daily - 7 x weekly - 1 sets - 3 reps - 30 sec hold Hooklying Hamstring Stretch with Strap - 2 x daily - 7 x weekly - 1 sets - 3 reps - 30 sec hold Modified Thomas Stretch - 2 x daily - 7 x weekly - 1 sets - 3 reps - 30 sec hold Hooklying Single Knee to Chest Stretch - 2 x daily - 7 x weekly - 1 sets - 3-5 reps - 20-30 sec hold  Patient Education Trigger Point Dry Needling TENS Unit

## 2021-05-23 ENCOUNTER — Encounter: Payer: Self-pay | Admitting: Rehabilitative and Restorative Service Providers"

## 2021-05-23 ENCOUNTER — Ambulatory Visit (INDEPENDENT_AMBULATORY_CARE_PROVIDER_SITE_OTHER): Payer: Medicare HMO | Admitting: Rehabilitative and Restorative Service Providers"

## 2021-05-23 ENCOUNTER — Other Ambulatory Visit: Payer: Self-pay

## 2021-05-23 DIAGNOSIS — M6281 Muscle weakness (generalized): Secondary | ICD-10-CM | POA: Diagnosis not present

## 2021-05-23 DIAGNOSIS — M25652 Stiffness of left hip, not elsewhere classified: Secondary | ICD-10-CM | POA: Diagnosis not present

## 2021-05-23 DIAGNOSIS — R29898 Other symptoms and signs involving the musculoskeletal system: Secondary | ICD-10-CM

## 2021-05-23 DIAGNOSIS — M25651 Stiffness of right hip, not elsewhere classified: Secondary | ICD-10-CM

## 2021-05-23 DIAGNOSIS — H52223 Regular astigmatism, bilateral: Secondary | ICD-10-CM | POA: Diagnosis not present

## 2021-05-23 DIAGNOSIS — M25552 Pain in left hip: Secondary | ICD-10-CM

## 2021-05-23 NOTE — Therapy (Signed)
[place], [place], [address] Phone: [phone]   Fax:  [phone]  Physical Therapy Treatment  Patient Details  Name: Glenn Meyer MRN: [id number] Date of Birth: 1951/03/10 Referring Provider (PT): Dr Dianah Field   Encounter Date: 05/23/2021   PT End of Session - 05/23/21 0851     Visit Number 2    Number of Visits 12    Date for PT Re-Evaluation 06/29/21    PT Start Time 0848    PT Stop Time 0937    PT Time Calculation (min) 49 min    Activity Tolerance Patient tolerated treatment well             Past Medical History:  Diagnosis Date   Hyperlipidemia    Hypertension    Ringing of ears    chronic X 20 years   Tinnitus    TMJ (dislocation of temporomandibular joint)     Past Surgical History:  Procedure Laterality Date   TONSILECTOMY, ADENOIDECTOMY, BILATERAL MYRINGOTOMY AND TUBES     TONSILLECTOMY     age 70    There were no vitals filed for this visit.   Subjective Assessment - 05/23/21 0852     Subjective Working on his stretches at home consistently and added the new stretches without difficulty. Not a good day yesterday - noticed inside thigh tightness and pain. Did not do anything diffierent that he is aware of.    Currently in Pain? Yes    Pain Score 3     Pain Location Hip    Pain Orientation Left;Anterior;Posterior    Pain Descriptors / Indicators Aching;Nagging    Pain Type Acute pain    Pain Onset More than a month ago                               [facility] Adult PT Treatment/Exercise - 05/23/21 0001       Knee/Hip Exercises: Stretches   Passive Hamstring Stretch Left;2 reps;30 seconds   supine with strap   Passive Hamstring Stretch Limitations added seated HS stretch    Quad Stretch Left;2 reps;30 seconds   prone with strap   Hip Flexor Stretch Left;2 reps;30 seconds    Hip Flexor Stretch Limitations supine thomas position; repeated  sitting    Other Knee/Hip Stretches adductor stretch Lt 30 sec hold x 3 reps      Knee/Hip Exercises: Aerobic   Tread Mill 2.7 mph x 6 min      Knee/Hip Exercises: Standing   Other Standing Knee Exercises backward walking x 20 ft x 6 reps      Knee/Hip Exercises: Sidelying   Hip ABduction AROM;Strengthening;Left;10 reps    Hip ABduction Limitations verbal and tactile cues for hip extension leading with heel      Knee/Hip Exercises: Prone   Other Prone Exercises toe down extending knee 5 sec x 10 reps    Other Prone Exercises prone prop 30 sec x 3 reps      Manual Therapy   Manual therapy comments pt prone    Joint Mobilization PA mobs Lt greater trochanter    Soft tissue mobilization deep tissue  work through the Lt posterior hip through gluts/piriformis    Passive ROM IR/ER Lt hip pt prone hip ext; knee flexed                    PT Education -  05/23/21 0931     Education Details HEP    Person(s) Educated Patient    Methods Explanation;Demonstration;Tactile cues;Verbal cues;Handout    Comprehension Verbalized understanding;Returned demonstration;Verbal cues required;Tactile cues required                 PT Long Term Goals - 05/18/21 1559       PT LONG TERM GOAL #1   Title Increase Lt hip flexion by 5-10 deg with no pain with movement    Time 6    Period Weeks    Status New    Target Date 06/29/21      PT LONG TERM GOAL #2   Title Increasd strength bilat LE's by 1/2 to 1 muscle grade    Time 6    Period Weeks    Status New    Target Date 06/29/21      PT LONG TERM GOAL #3   Title Patient reports minimal pain no greater than 1-2/10 for functional activities including walking and no pain awakening him during sleep    Time 6    Period Weeks    Status New    Target Date 06/29/21      PT LONG TERM GOAL #4   Title Independent in HEP including aquatic exercise program as indicated    Time 6    Period Weeks    Status New    Target Date 06/29/21       PT LONG TERM GOAL #5   Title Improve functional limitation score to 71    Time 6    Period Weeks    Status New    Target Date 06/29/21                   Plan - 05/23/21 0856     Clinical Impression Statement Patient reports good response to stretching. he is working on the exercises daily. Will increase frequency for seated hip flexor stretch. Continued pain with prolonged standing. Added exercises without difficulty. Will wait to try DN at next visit per pt request.    Rehab Potential Good    PT Frequency 2x / week    PT Duration 6 weeks    PT Treatment/Interventions ADLs/Self Care Home Management;Aquatic Therapy;Cryotherapy;Iontophoresis '4mg'$ /ml Dexamethasone;Moist Heat;Ultrasound;Gait training;Stair training;Patient/family education;Functional mobility training;Therapeutic activities;Therapeutic exercise;Balance training;Neuromuscular re-education;Manual techniques;Passive range of motion;Dry needling;Taping    PT Next Visit Plan review HEP; trial of DN Lt hip flexors and posterior hip gluts/piriformis; stretching Lt/Rt hip; progress with strengthening; postural correction working on trunk extension(stretch for pecs); postural strengthening - push/pull sports cord; has TENs unit at home he will try for hip pain; modalities as indicated; suggestions/corrections for gym program; aquatic exercise program    PT Home Exercise Plan [email or web address]    Consulted and Agree with Plan of Care Patient             Patient will benefit from skilled therapeutic intervention in order to improve the following deficits and impairments:     Visit Diagnosis: Pain in left hip  Stiffness of both hip joints  Muscle weakness (generalized)  Other symptoms and signs involving the musculoskeletal system     Problem List Patient Active Problem List   Diagnosis Date Noted   Acute left-sided low back pain without sciatica 04/12/2021   Low testosterone 12/29/2020   TMJ (dislocation of  temporomandibular joint)    Tinnitus    Ringing of ears    Hypertension    BPH associated with nocturia 07/19/2020  Abnormal weight gain 07/19/2020   Patellofemoral arthritis of right knee 10/02/2018   IFG (impaired fasting glucose) 07/30/2018   Degenerative arthritis of left hip 10/03/2012   SHOULDER PAIN, RIGHT 03/17/2008   Hyperlipidemia 08/08/2007   HYPERTENSION, BENIGN ESSENTIAL 07/30/2007   GERD 07/30/2007    Montez Cuda Nilda Simmer PT, MPH  05/23/2021, 9:39 AM  [facility] [place], [place], [address] Phone: [phone]   Fax:  [phone]  Name: Glenn Meyer MRN: [id number] Date of Birth: 06-17-1951

## 2021-05-23 NOTE — Patient Instructions (Signed)
Access Code: [email or web address] URL: [email or web address] Date: 05/23/2021 Prepared by: Gillermo Murdoch  Exercises Prone Quadriceps Stretch with Strap - 2 x daily - 7 x weekly - 1 sets - 3 reps - 30 sec hold Hooklying Hamstring Stretch with Strap - 2 x daily - 7 x weekly - 1 sets - 3 reps - 30 sec hold Hip Adductors and Hamstring Stretch with Strap - 2 x daily - 7 x weekly - 1 sets - 3 reps - 30 sec hold Modified Thomas Stretch - 2 x daily - 7 x weekly - 1 sets - 3 reps - 30 sec hold Hooklying Single Knee to Chest Stretch - 2 x daily - 7 x weekly - 1 sets - 3-5 reps - 20-30 sec hold Seated Hamstring Stretch - 2 x daily - 7 x weekly - 1 sets - 3 reps - 30 sec hold Hip Adductors and Hamstring Stretch with Strap - 2 x daily - 7 x weekly - 1 sets - 3 reps - 30 sec hold Prone Press Up On Elbows - 2 x daily - 7 x weekly - 1 sets - 3 reps - 30 sec hold

## 2021-05-24 ENCOUNTER — Other Ambulatory Visit: Payer: Self-pay | Admitting: Family Medicine

## 2021-05-24 ENCOUNTER — Ambulatory Visit: Payer: Medicare HMO | Admitting: Sports Medicine

## 2021-05-24 DIAGNOSIS — N401 Enlarged prostate with lower urinary tract symptoms: Secondary | ICD-10-CM

## 2021-05-25 ENCOUNTER — Other Ambulatory Visit: Payer: Self-pay

## 2021-05-25 ENCOUNTER — Encounter: Payer: Self-pay | Admitting: Rehabilitative and Restorative Service Providers"

## 2021-05-25 ENCOUNTER — Ambulatory Visit (INDEPENDENT_AMBULATORY_CARE_PROVIDER_SITE_OTHER): Payer: Medicare HMO | Admitting: Rehabilitative and Restorative Service Providers"

## 2021-05-25 DIAGNOSIS — M25651 Stiffness of right hip, not elsewhere classified: Secondary | ICD-10-CM | POA: Diagnosis not present

## 2021-05-25 DIAGNOSIS — M6281 Muscle weakness (generalized): Secondary | ICD-10-CM | POA: Diagnosis not present

## 2021-05-25 DIAGNOSIS — M25652 Stiffness of left hip, not elsewhere classified: Secondary | ICD-10-CM

## 2021-05-25 DIAGNOSIS — R29898 Other symptoms and signs involving the musculoskeletal system: Secondary | ICD-10-CM | POA: Diagnosis not present

## 2021-05-25 DIAGNOSIS — M25552 Pain in left hip: Secondary | ICD-10-CM

## 2021-05-25 NOTE — Therapy (Addendum)
[place], [place], [address] Phone: [phone]   Fax:  [phone]  Physical Therapy Treatment  Patient Details  Name: Glenn Meyer MRN: [id number] Date of Birth: 11-Dec-1950 Referring Provider (PT): Dr Dianah Field   Encounter Date: 05/25/2021   PT End of Session - 05/25/21 1615     Visit Number 3    Number of Visits 12    Date for PT Re-Evaluation 06/29/21    PT Start Time 6979    PT Stop Time 1700    PT Time Calculation (min) 46 min    Activity Tolerance Patient tolerated treatment well             Past Medical History:  Diagnosis Date   Hyperlipidemia    Hypertension    Ringing of ears    chronic X 20 years   Tinnitus    TMJ (dislocation of temporomandibular joint)     Past Surgical History:  Procedure Laterality Date   TONSILECTOMY, ADENOIDECTOMY, BILATERAL MYRINGOTOMY AND TUBES     TONSILLECTOMY     age 71    There were no vitals filed for this visit.   Subjective Assessment - 05/25/21 1616     Subjective Still has some pain. He is doing the exercises and working out at [employer]. He is working on his posture with exercise and activities. Feels he is gradually improving with the exercises. Still having pain mostly in the front of the hip but did feel it in the back of the hip last night. Bought a pillow today to use btn knees when sleeping.    Currently in Pain? Yes    Pain Score 2     Pain Location Hip    Pain Orientation Left;Anterior;Posterior    Pain Descriptors / Indicators Aching;Nagging                               [facility] Adult PT Treatment/Exercise - 05/25/21 0001       Knee/Hip Exercises: Stretches   Passive Hamstring Stretch Left;2 reps;30 seconds   supine with strap   Quad Stretch Left;2 reps;30 seconds   prone with strap   Hip Flexor Stretch Left;2 reps;30 seconds    Hip Flexor Stretch Limitations supine thomas position; repeated  sitting    Piriformis Stretch Left;3 reps;30 seconds    Piriformis Stretch Limitations supine travell with strap    Other Knee/Hip Stretches adductor stretch Lt 30 sec hold x 3 reps      Knee/Hip Exercises: Aerobic   Tread Mill 2.7 mph x 5 min      Knee/Hip Exercises: Standing   Wall Squat 10 reps;10 seconds      Knee/Hip Exercises: Seated   Sit to Sand 10 reps;without UE support   slow stand to sit core engaged     Manual Therapy   Manual therapy comments skilled palpatioin to assess response to DN and manual work    Joint Mobilization PA mobs Lt greater trochanter; lumbar spine    Soft tissue mobilization deep tissue  work through the [place] posterior hip through gluts/piriformis    Passive ROM IR/ER Lt hip pt prone hip ext; knee flexed              Trigger Point Dry Needling - 05/25/21 0001     Consent Given? Yes    Education Handout Provided Yes    Dry Needling Comments Lt  Gluteus Medius Response Palpable increased muscle length    Gluteus Maximus Response Palpable increased muscle length    Piriformis Response Palpable increased muscle length                  PT Education - 05/25/21 1653     Education Details DN HEP    Person(s) Educated Patient    Methods Explanation;Demonstration;Tactile cues;Verbal cues;Handout    Comprehension Verbalized understanding;Returned demonstration;Verbal cues required;Tactile cues required                 PT Long Term Goals - 05/18/21 1559       PT LONG TERM GOAL #1   Title Increase Lt hip flexion by 5-10 deg with no pain with movement    Time 6    Period Weeks    Status New    Target Date 06/29/21      PT LONG TERM GOAL #2   Title Increasd strength bilat LE's by 1/2 to 1 muscle grade    Time 6    Period Weeks    Status New    Target Date 06/29/21      PT LONG TERM GOAL #3   Title Patient reports minimal pain no greater than 1-2/10 for functional activities including walking and no pain awakening him  during sleep    Time 6    Period Weeks    Status New    Target Date 06/29/21      PT LONG TERM GOAL #4   Title Independent in HEP including aquatic exercise program as indicated    Time 6    Period Weeks    Status New    Target Date 06/29/21      PT LONG TERM GOAL #5   Title Improve functional limitation score to 71    Time 6    Period Weeks    Status New    Target Date 06/29/21                   Plan - 05/25/21 1620     Clinical Impression Statement Gradual progress with patient reporting decreased pain and improving mobility with HEP. He is working on the exercises consistently at home. Trial of DN to posterior Lt hip tolerated well with release of tightnes in piriformis and gluts noted    Rehab Potential Good    PT Frequency 2x / week    PT Duration 6 weeks    PT Treatment/Interventions ADLs/Self Care Home Management;Aquatic Therapy;Cryotherapy;Iontophoresis 74m/ml Dexamethasone;Moist Heat;Ultrasound;Gait training;Stair training;Patient/family education;Functional mobility training;Therapeutic activities;Therapeutic exercise;Balance training;Neuromuscular re-education;Manual techniques;Passive range of motion;Dry needling;Taping    PT Next Visit Plan review HEP; assess response to trial DN Lt hip flexors and posterior hip gluts/piriformis; stretching Lt/Rt hip; progress with strengthening; postural correction working on trunk extension(stretch for pecs); postural strengthening - push/pull sports cord; has TENs unit at home he will try for hip pain; modalities as indicated; suggestions/corrections for gym program; aquatic exercise program    PT Home Exercise Plan [email or web address]    Consulted and Agree with Plan of Care Patient             Patient will benefit from skilled therapeutic intervention in order to improve the following deficits and impairments:     Visit Diagnosis: Pain in left hip  Stiffness of both hip joints  Muscle weakness (generalized)  Other  symptoms and signs involving the musculoskeletal system     Problem List Patient Active Problem List   Diagnosis  Date Noted   Acute left-sided low back pain without sciatica 04/12/2021   Low testosterone 12/29/2020   TMJ (dislocation of temporomandibular joint)    Tinnitus    Ringing of ears    Hypertension    BPH associated with nocturia 07/19/2020   Abnormal weight gain 07/19/2020   Patellofemoral arthritis of right knee 10/02/2018   IFG (impaired fasting glucose) 07/30/2018   Degenerative arthritis of left hip 10/03/2012   SHOULDER PAIN, RIGHT 03/17/2008   Hyperlipidemia 08/08/2007   HYPERTENSION, BENIGN ESSENTIAL 07/30/2007   GERD 07/30/2007    Yariel Ferraris Nilda Simmer PT, MPH  05/25/2021, 4:58 PM  [facility] [place], [place], [address] Phone: [phone]   Fax:  [phone]  Name: Glenn Meyer MRN: [id number] Date of Birth: Feb 12, 1951   PHYSICAL THERAPY DISCHARGE SUMMARY  Visits from Start of Care: 3  Current functional level related to goals / functional outcomes: See progress note for discharge status    Remaining deficits: Some continued muscular tightness    Education / Equipment: HEP   Patient agrees to discharge. Patient goals were partially met. Patient is being discharged due to being pleased with the current functional level.   Ferris Fielden P. Helene Kelp PT, MPH 05/29/21 9:10 AM

## 2021-05-25 NOTE — Patient Instructions (Addendum)
Trigger Point Dry Needling  What is Trigger Point Dry Needling (DN)? DN is a physical therapy technique used to treat muscle pain and dysfunction. Specifically, DN helps deactivate muscle trigger points (muscle knots).  A thin filiform needle is used to penetrate the skin and stimulate the underlying trigger point. The goal is for a local twitch response (LTR) to occur and for the trigger point to relax. No medication of any kind is injected during the procedure.   What Does Trigger Point Dry Needling Feel Like?  The procedure feels different for each individual patient. Some patients report that they do not actually feel the needle enter the skin and overall the process is not painful. Very mild bleeding may occur. However, many patients feel a deep cramping in the muscle in which the needle was inserted. This is the local twitch response.   How Will I feel after the treatment? Soreness is normal, and the onset of soreness may not occur for a few hours. Typically this soreness does not last longer than two days.  Bruising is uncommon, however; ice can be used to decrease any possible bruising.  In rare cases feeling tired or nauseous after the treatment is normal. In addition, your symptoms may get worse before they get better, this period will typically not last longer than 24 hours.   What Can I do After My Treatment? Increase your hydration by drinking more water for the next 24 hours. You may place ice or heat on the areas treated that have become sore, however, do not use heat on inflamed or bruised areas. Heat often brings more relief post needling. You can continue your regular activities, but vigorous activity is not recommended initially after the treatment for 24 hours. DN is best combined with other physical therapy such as strengthening, stretching, and other therapies.  Access Code: [email or web address] URL: [email or web address] Date: 05/25/2021 Prepared by: Gillermo Murdoch  Exercises Prone Quadriceps Stretch with Strap - 2 x daily - 7 x weekly - 1 sets - 3 reps - 30 sec hold Hooklying Hamstring Stretch with Strap - 2 x daily - 7 x weekly - 1 sets - 3 reps - 30 sec hold Modified Thomas Stretch - 2 x daily - 7 x weekly - 1 sets - 3 reps - 30 sec hold Hooklying Single Knee to Chest Stretch - 2 x daily - 7 x weekly - 1 sets - 3-5 reps - 20-30 sec hold Seated Hamstring Stretch - 2 x daily - 7 x weekly - 1 sets - 3 reps - 30 sec hold Hip Adductors and Hamstring Stretch with Strap - 2 x daily - 7 x weekly - 1 sets - 3 reps - 30 sec hold Prone Press Up On Elbows - 2 x daily - 7 x weekly - 1 sets - 3 reps - 30 sec hold Sit to Stand - 2 x daily - 7 x weekly - 1 sets - 10 reps - 3-5 sec hold Wall Quarter Squat - 2 x daily - 7 x weekly - 1-2 sets - 10 reps - 5-10 sec hold

## 2021-05-30 ENCOUNTER — Encounter: Payer: Medicare HMO | Admitting: Rehabilitative and Restorative Service Providers"

## 2021-05-30 ENCOUNTER — Encounter: Payer: Self-pay | Admitting: Family Medicine

## 2021-05-30 NOTE — Telephone Encounter (Signed)
Please call pt to schedule his annual wellness visit per pt's request.  Charyl Bigger, CMA

## 2021-06-01 ENCOUNTER — Encounter: Payer: Medicare HMO | Admitting: Rehabilitative and Restorative Service Providers"

## 2021-06-06 ENCOUNTER — Encounter: Payer: Medicare HMO | Admitting: Rehabilitative and Restorative Service Providers"

## 2021-06-08 ENCOUNTER — Encounter: Payer: Medicare HMO | Admitting: Rehabilitative and Restorative Service Providers"

## 2021-06-20 ENCOUNTER — Ambulatory Visit: Payer: Medicare HMO | Admitting: Sports Medicine

## 2021-06-26 ENCOUNTER — Encounter: Payer: Self-pay | Admitting: Family Medicine

## 2021-08-08 ENCOUNTER — Encounter: Payer: Self-pay | Admitting: Family Medicine

## 2021-08-08 ENCOUNTER — Other Ambulatory Visit: Payer: Self-pay

## 2021-08-08 ENCOUNTER — Ambulatory Visit (INDEPENDENT_AMBULATORY_CARE_PROVIDER_SITE_OTHER): Payer: Medicare HMO | Admitting: Family Medicine

## 2021-08-08 VITALS — BP 122/69 | HR 55 | Ht 76.0 in | Wt 222.0 lb

## 2021-08-08 DIAGNOSIS — R635 Abnormal weight gain: Secondary | ICD-10-CM | POA: Diagnosis not present

## 2021-08-08 DIAGNOSIS — Z0000 Encounter for general adult medical examination without abnormal findings: Secondary | ICD-10-CM | POA: Diagnosis not present

## 2021-08-08 DIAGNOSIS — E785 Hyperlipidemia, unspecified: Secondary | ICD-10-CM | POA: Diagnosis not present

## 2021-08-08 DIAGNOSIS — R7989 Other specified abnormal findings of blood chemistry: Secondary | ICD-10-CM

## 2021-08-08 DIAGNOSIS — R7301 Impaired fasting glucose: Secondary | ICD-10-CM | POA: Diagnosis not present

## 2021-08-08 DIAGNOSIS — Z23 Encounter for immunization: Secondary | ICD-10-CM | POA: Diagnosis not present

## 2021-08-08 MED ORDER — TESTOSTERONE 20.25 MG/ACT (1.62%) TD GEL: 1.0000 | Freq: Every morning | TRANSDERMAL | 1 refills | Status: DC

## 2021-08-08 MED ORDER — METFORMIN HCL 500 MG PO TABS: 500.0000 mg | ORAL_TABLET | Freq: Two times a day (BID) | ORAL | 1 refills | Status: DC

## 2021-08-08 NOTE — Assessment & Plan Note (Signed)
We discussed restarting the testosterone but just 1 pump a day instead of 2 we will see if he tolerates it well if it seems to be impacting the blood pressure or not.

## 2021-08-08 NOTE — Assessment & Plan Note (Signed)
Unfortunately his insurance would not cover any of the newer weight loss medications he still really like to lose about 20 pounds it is just really struggling.  We discussed maybe using metformin off label as there is some evidence that it can cause a small amount of weight loss and is relatively safe.  We can at least try it for 30 days and see if it is helpful continue to work on regular exercise which has been doing pretty consistently and continue to work on portion control and dietary choices.

## 2021-08-08 NOTE — Assessment & Plan Note (Signed)
He is tolerating the statin well so far but has had some concerns about potential side effects we discussed those.  His cardiovascular risk was greater than 10% so he would benefit significantly from being on a statin to reduce cardiovascular risk over the next 10 years.  The 10-year ASCVD risk score (Arnett DK, et al., 2019) is: 14.5%   Values used to calculate the score:     Age: 70 years     Sex: Male     Is Non-Hispanic African American: No     Diabetic: No     Tobacco smoker: No     Systolic Blood Pressure: 920 mmHg     Is BP treated: Yes     HDL Cholesterol: 43 mg/dL     Total Cholesterol: 134 mg/dL

## 2021-08-08 NOTE — Progress Notes (Signed)
CPE  Subjective:  Patient ID: Glenn Meyer, male    DOB: 04/28/1951  Age: 70 y.o. MRN: [id number]  CC:  Chief Complaint  Patient presents with   Annual Exam    HPI Glenn Meyer presents for CPE.  He is doing well overall.  Staying active.  He has been really struggling with weight loss and would really like to try to lose weight he did a keto diet and lost about 6 to 7 pounds but unfortunately has already gained part of it back he said it was just hard to stick to it.  It is hard to eat differently than the rest of his family who do not have weight concerns.  He also wanted to discuss possibly restarting his testosterone replacement. When he was using 2 pumps he noted his BP was elevated.  So he stopped it a couple of months ago he is wondering if he can restart it may be at a lower dose and just keep an eye on the blood pressure.  Also had some questions about his statin and potential side effects that he read online including fatigue and low testosterone and low libido.  10-year cardiovascular risk score is greater than 10% and he did have mild coronary artery disease noted on CT of the heart performed in February 2022.  Past Medical History:  Diagnosis Date   Hyperlipidemia    Hypertension    Ringing of ears    chronic X 20 years   Tinnitus    TMJ (dislocation of temporomandibular joint)     Past Surgical History:  Procedure Laterality Date   TONSILECTOMY, ADENOIDECTOMY, BILATERAL MYRINGOTOMY AND TUBES     TONSILLECTOMY     age 79    Family History  Problem Relation Age of Onset   Hypertension Mother    Hyperlipidemia Mother    Kidney disease Mother        congenital/diaylisis 55's   Heart disease Father        bypass 2000   Hypertension Other    Hyperlipidemia Other     Social History   Socioeconomic History   Marital status: Married    Spouse name: [name]   Number of children: 3   Years of education: 16   Highest education level: Bachelor's degree (e.g.,  BA, AB, BS)  Occupational History   Occupation: Librarian, academic    Comment: retired  Tobacco Use   Smoking status: Former    Years: 3.00    Types: Cigarettes    Quit date: 10/22/1972    Years since quitting: 48.8   Smokeless tobacco: Never   Tobacco comments:    quit 34 years ago  Vaping Use   Vaping Use: Never used  Substance and Sexual Activity   Alcohol use: Yes    Alcohol/week: 2.0 standard drinks    Types: 2 Cans of beer per week    Comment: per week   Drug use: No   Sexual activity: Not on file  Other Topics Concern   Not on file  Social History Narrative   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 (Patient taking differently: Take 40 mg by mouth every other day.) 90 tablet 3   esomeprazole (NEXIUM) 40 MG capsule Take 40 mg by mouth daily at 12 noon.     GLUCOSAMINE SULFATE PO Take 1,000 mg by mouth daily.     LUTEIN PO Take 25 mg by mouth daily.     Multiple Vitamin (MULTIVITAMIN) capsule Take 1 capsule by mouth daily.     Omega-3 Fatty Acids (FISH OIL) 1000 MG CAPS Take by mouth.     tadalafil (CIALIS) 5 MG tablet TAKE 1 TABLET BY MOUTH ONCE DAILY AS NEEDED FOR ERECTILE DYSFUNCTION 90 tablet 0   Turmeric 450 MG CAPS Take 1 capsule by mouth daily.     aspirin EC 81 MG tablet Take 1 tablet (81 mg total) by mouth daily. Swallow whole. 90 tablet 3   Cholecalciferol (D3-1000 PO) Take 1 tablet by mouth daily.     ibuprofen (ADVIL) 800 MG tablet Take 1  tablet (800 mg total) by mouth every 8 (eight) hours as needed. 90 tablet 2   Misc Natural Products (LUTEIN 20 PO) Take 25 mg by mouth.     pyridOXINE (VITAMIN B-6) 100 MG tablet Take 100 mg by mouth daily.     No facility-administered medications prior to visit.    Allergies  Allergen Reactions   Lisinopril-Hydrochlorothiazide     REACTION: Cough   Meperidine Hcl     REACTION: nausea    ROS Review of Systems    Objective:    Physical Exam Constitutional:      Appearance: Normal appearance. He is well-developed.  HENT:     Head: Normocephalic and atraumatic.  Cardiovascular:     Rate and Rhythm: Normal rate and regular rhythm.     Heart sounds: Normal heart sounds.  Pulmonary:     Effort: Pulmonary effort is normal.     Breath sounds: Normal breath sounds.  Skin:    General: Skin is warm and dry.  Neurological:     Mental Status: He is alert and oriented to person, place, and time. Mental status is at baseline.  Psychiatric:        Behavior: Behavior normal.    BP 122/69   Pulse (!) 55   Ht 6\' 4"  (1.93 m)   Wt 222 lb (100.7 kg)   SpO2 99%   BMI 27.02 kg/m  Wt Readings from Last 3 Encounters:  08/08/21 222 lb (100.7 kg)  03/12/21 225 lb (102.1 kg)  01/10/21 226 lb 12.8 oz (102.9 kg)     There are no preventive care reminders to display for this patient.   There are no preventive care reminders to display for this patient.  Lab Results  Component Value Date   TSH 2.04 11/16/2020   Lab Results  Component Value Date   WBC 4.3 11/16/2020   HGB 15.0 11/16/2020   HCT 44.4 11/16/2020   MCV 87.1 11/16/2020   PLT 159 11/16/2020   Lab Results  Component Value Date   NA 139 11/24/2020   K 4.5 11/24/2020   CO2 25 11/24/2020   GLUCOSE 109 (H) 11/24/2020   BUN 21 11/24/2020   CREATININE 0.96 11/24/2020   BILITOT 0.5 11/16/2020   ALKPHOS 56 08/03/2015   AST 25 11/16/2020   ALT 25 11/16/2020  PROT 6.9 11/16/2020   ALBUMIN 4.1 08/03/2015   CALCIUM  10.0 11/24/2020   Lab Results  Component Value Date   CHOL 134 11/16/2020   Lab Results  Component Value Date   HDL 43 11/16/2020   Lab Results  Component Value Date   LDLCALC 74 11/16/2020   Lab Results  Component Value Date   TRIG 88 11/16/2020   Lab Results  Component Value Date   CHOLHDL 3.1 11/16/2020   Lab Results  Component Value Date   HGBA1C 5.9 (H) 11/16/2020      Assessment & Plan:   Problem List Items Addressed This Visit       Endocrine   IFG (impaired fasting glucose)   Relevant Orders   Hemoglobin A1c     Other   Low testosterone    We discussed restarting the testosterone but just 1 pump a day instead of 2 we will see if he tolerates it well if it seems to be impacting the blood pressure or not.      Relevant Medications   Testosterone 20.25 MG/ACT (1.62%) GEL   Other Relevant Orders   Testosterone Total,Free,Bio, Males   Abnormal weight gain    Unfortunately his insurance would not cover any of the newer weight loss medications he still really like to lose about 20 pounds it is just really struggling.  We discussed maybe using metformin off label as there is some evidence that it can cause a small amount of weight loss and is relatively safe.  We can at least try it for 30 days and see if it is helpful continue to work on regular exercise which has been doing pretty consistently and continue to work on portion control and dietary choices.      Other Visit Diagnoses     Well adult exam    -  Primary   Relevant Orders   Lipid Panel w/reflex Direct LDL   COMPLETE METABOLIC PANEL WITH GFR   CBC   PSA   Need for immunization against influenza       Relevant Orders   Flu Vaccine QUAD High Dose(Fluad) (Completed)      Keep up a regular exercise program and make sure you are eating a healthy diet Try to eat 4 servings of dairy a day, or if you are lactose intolerant take a calcium with vitamin D daily.  Your vaccines are up to date.    Meds ordered this encounter  Medications   metFORMIN (GLUCOPHAGE) 500 MG tablet    Sig: Take 1 tablet (500 mg total) by mouth 2 (two) times daily with a meal.    Dispense:  60 tablet    Refill:  1   Testosterone 20.25 MG/ACT (1.62%) GEL    Sig: Place 1 Pump onto the skin in the morning.    Dispense:  75 g    Refill:  1     Follow-up: Return in about 6 months (around 02/06/2022) for Hypertension and glucose.  Beatrice Lecher, MD

## 2021-08-09 DIAGNOSIS — R7989 Other specified abnormal findings of blood chemistry: Secondary | ICD-10-CM | POA: Diagnosis not present

## 2021-08-09 DIAGNOSIS — Z0000 Encounter for general adult medical examination without abnormal findings: Secondary | ICD-10-CM | POA: Diagnosis not present

## 2021-08-09 DIAGNOSIS — R7301 Impaired fasting glucose: Secondary | ICD-10-CM | POA: Diagnosis not present

## 2021-08-09 DIAGNOSIS — Z125 Encounter for screening for malignant neoplasm of prostate: Secondary | ICD-10-CM | POA: Diagnosis not present

## 2021-08-10 LAB — COMPLETE METABOLIC PANEL WITH GFR
AG Ratio: 1.5 (calc) (ref 1.0–2.5)
ALT: 21 U/L (ref 9–46)
AST: 25 U/L (ref 10–35)
Albumin: 4.4 g/dL (ref 3.6–5.1)
Alkaline phosphatase (APISO): 63 U/L (ref 35–144)
BUN: 18 mg/dL (ref 7–25)
CO2: 27 mmol/L (ref 20–32)
Calcium: 9.9 mg/dL (ref 8.6–10.3)
Chloride: 103 mmol/L (ref 98–110)
Creat: 0.98 mg/dL (ref 0.70–1.35)
Globulin: 3 g/dL (calc) (ref 1.9–3.7)
Glucose, Bld: 114 mg/dL — ABNORMAL HIGH (ref 65–99)
Potassium: 4.7 mmol/L (ref 3.5–5.3)
Sodium: 140 mmol/L (ref 135–146)
Total Bilirubin: 0.5 mg/dL (ref 0.2–1.2)
Total Protein: 7.4 g/dL (ref 6.1–8.1)
eGFR: 84 mL/min/{1.73_m2} (ref >=60)

## 2021-08-10 LAB — TESTOSTERONE TOTAL,FREE,BIO, MALES
Albumin: 4.4 g/dL (ref 3.6–5.1)
Sex Hormone Binding: 23 nmol/L (ref 22–77)
Testosterone, Bioavailable: 113.7 ng/dL (ref 110.0–575.0)
Testosterone, Free: 56.5 pg/mL (ref 46.0–224.0)
Testosterone: 331 ng/dL (ref 250–827)

## 2021-08-10 LAB — CBC
HCT: 46.3 % (ref 38.5–50.0)
Hemoglobin: 15.1 g/dL (ref 13.2–17.1)
MCH: 28.2 pg (ref 27.0–33.0)
MCHC: 32.6 g/dL (ref 32.0–36.0)
MCV: 86.5 fL (ref 80.0–100.0)
MPV: 12.5 fL (ref 7.5–12.5)
Platelets: 180 10*3/uL (ref 140–400)
RBC: 5.35 10*6/uL (ref 4.20–5.80)
RDW: 13.3 % (ref 11.0–15.0)
WBC: 5.2 10*3/uL (ref 3.8–10.8)

## 2021-08-10 LAB — LIPID PANEL W/REFLEX DIRECT LDL
Cholesterol: 159 mg/dL (ref <200)
HDL: 48 mg/dL (ref >=40)
LDL Cholesterol (Calc): 94 mg/dL (calc)
Non-HDL Cholesterol (Calc): 111 mg/dL (calc) (ref <130)
Total CHOL/HDL Ratio: 3.3 (calc) (ref <5.0)
Triglycerides: 83 mg/dL (ref <150)

## 2021-08-10 LAB — HEMOGLOBIN A1C
Hgb A1c MFr Bld: 5.7 % of total Hgb — ABNORMAL HIGH (ref <5.7)
Mean Plasma Glucose: 117 mg/dL
eAG (mmol/L): 6.5 mmol/L

## 2021-08-10 LAB — PSA: PSA: 1.84 ng/mL (ref <=4.00)

## 2021-08-11 DIAGNOSIS — M5451 Vertebrogenic low back pain: Secondary | ICD-10-CM | POA: Diagnosis not present

## 2021-08-11 DIAGNOSIS — M461 Sacroiliitis, not elsewhere classified: Secondary | ICD-10-CM | POA: Diagnosis not present

## 2021-08-11 DIAGNOSIS — M9906 Segmental and somatic dysfunction of lower extremity: Secondary | ICD-10-CM | POA: Diagnosis not present

## 2021-08-11 DIAGNOSIS — M9904 Segmental and somatic dysfunction of sacral region: Secondary | ICD-10-CM | POA: Diagnosis not present

## 2021-08-11 DIAGNOSIS — M9905 Segmental and somatic dysfunction of pelvic region: Secondary | ICD-10-CM | POA: Diagnosis not present

## 2021-08-11 DIAGNOSIS — M9903 Segmental and somatic dysfunction of lumbar region: Secondary | ICD-10-CM | POA: Diagnosis not present

## 2021-08-11 DIAGNOSIS — M9902 Segmental and somatic dysfunction of thoracic region: Secondary | ICD-10-CM | POA: Diagnosis not present

## 2021-08-11 DIAGNOSIS — M546 Pain in thoracic spine: Secondary | ICD-10-CM | POA: Diagnosis not present

## 2021-08-11 DIAGNOSIS — M25552 Pain in left hip: Secondary | ICD-10-CM | POA: Diagnosis not present

## 2021-08-11 NOTE — Progress Notes (Signed)
Hi Glenn Meyer, Your cholesterol looks good overall.  Your LDL has jumped up some.  You got it down to 74 and it is now 94.  Your blood count and metabolic panel look normal.  Prostate test is normal.  Testosterone levels also actually look pretty good.  They are in the normal range a little bit on the low end but not on the extremely low end.  A1c looks better.  It was 5.9 is back down to 5.7 which is great.

## 2021-08-14 DIAGNOSIS — M9903 Segmental and somatic dysfunction of lumbar region: Secondary | ICD-10-CM | POA: Diagnosis not present

## 2021-08-14 DIAGNOSIS — M546 Pain in thoracic spine: Secondary | ICD-10-CM | POA: Diagnosis not present

## 2021-08-14 DIAGNOSIS — M9905 Segmental and somatic dysfunction of pelvic region: Secondary | ICD-10-CM | POA: Diagnosis not present

## 2021-08-14 DIAGNOSIS — M9906 Segmental and somatic dysfunction of lower extremity: Secondary | ICD-10-CM | POA: Diagnosis not present

## 2021-08-14 DIAGNOSIS — M25552 Pain in left hip: Secondary | ICD-10-CM | POA: Diagnosis not present

## 2021-08-14 DIAGNOSIS — M9902 Segmental and somatic dysfunction of thoracic region: Secondary | ICD-10-CM | POA: Diagnosis not present

## 2021-08-14 DIAGNOSIS — M5451 Vertebrogenic low back pain: Secondary | ICD-10-CM | POA: Diagnosis not present

## 2021-08-14 DIAGNOSIS — M461 Sacroiliitis, not elsewhere classified: Secondary | ICD-10-CM | POA: Diagnosis not present

## 2021-08-14 DIAGNOSIS — M9904 Segmental and somatic dysfunction of sacral region: Secondary | ICD-10-CM | POA: Diagnosis not present

## 2021-08-16 DIAGNOSIS — M9905 Segmental and somatic dysfunction of pelvic region: Secondary | ICD-10-CM | POA: Diagnosis not present

## 2021-08-16 DIAGNOSIS — M9903 Segmental and somatic dysfunction of lumbar region: Secondary | ICD-10-CM | POA: Diagnosis not present

## 2021-08-16 DIAGNOSIS — M9904 Segmental and somatic dysfunction of sacral region: Secondary | ICD-10-CM | POA: Diagnosis not present

## 2021-08-16 DIAGNOSIS — M461 Sacroiliitis, not elsewhere classified: Secondary | ICD-10-CM | POA: Diagnosis not present

## 2021-08-16 DIAGNOSIS — M9902 Segmental and somatic dysfunction of thoracic region: Secondary | ICD-10-CM | POA: Diagnosis not present

## 2021-08-16 DIAGNOSIS — M25552 Pain in left hip: Secondary | ICD-10-CM | POA: Diagnosis not present

## 2021-08-16 DIAGNOSIS — M9906 Segmental and somatic dysfunction of lower extremity: Secondary | ICD-10-CM | POA: Diagnosis not present

## 2021-08-16 DIAGNOSIS — M546 Pain in thoracic spine: Secondary | ICD-10-CM | POA: Diagnosis not present

## 2021-08-16 DIAGNOSIS — M5451 Vertebrogenic low back pain: Secondary | ICD-10-CM | POA: Diagnosis not present

## 2021-08-18 DIAGNOSIS — M9905 Segmental and somatic dysfunction of pelvic region: Secondary | ICD-10-CM | POA: Diagnosis not present

## 2021-08-18 DIAGNOSIS — M5451 Vertebrogenic low back pain: Secondary | ICD-10-CM | POA: Diagnosis not present

## 2021-08-18 DIAGNOSIS — M9906 Segmental and somatic dysfunction of lower extremity: Secondary | ICD-10-CM | POA: Diagnosis not present

## 2021-08-18 DIAGNOSIS — M461 Sacroiliitis, not elsewhere classified: Secondary | ICD-10-CM | POA: Diagnosis not present

## 2021-08-18 DIAGNOSIS — M546 Pain in thoracic spine: Secondary | ICD-10-CM | POA: Diagnosis not present

## 2021-08-18 DIAGNOSIS — M25552 Pain in left hip: Secondary | ICD-10-CM | POA: Diagnosis not present

## 2021-08-18 DIAGNOSIS — M9903 Segmental and somatic dysfunction of lumbar region: Secondary | ICD-10-CM | POA: Diagnosis not present

## 2021-08-18 DIAGNOSIS — M9902 Segmental and somatic dysfunction of thoracic region: Secondary | ICD-10-CM | POA: Diagnosis not present

## 2021-08-18 DIAGNOSIS — M9904 Segmental and somatic dysfunction of sacral region: Secondary | ICD-10-CM | POA: Diagnosis not present

## 2021-08-21 ENCOUNTER — Encounter: Payer: Self-pay | Admitting: Family Medicine

## 2021-08-21 ENCOUNTER — Other Ambulatory Visit: Payer: Self-pay | Admitting: Family Medicine

## 2021-08-21 DIAGNOSIS — I10 Essential (primary) hypertension: Secondary | ICD-10-CM

## 2021-08-21 MED ORDER — AMLODIPINE BESYLATE 5 MG PO TABS: 5.0000 mg | ORAL_TABLET | Freq: Every day | ORAL | 3 refills | Status: DC

## 2021-08-24 ENCOUNTER — Encounter: Payer: Self-pay | Admitting: Family Medicine

## 2021-08-24 MED ORDER — TADALAFIL 10 MG PO TABS: 10.0000 mg | ORAL_TABLET | Freq: Every day | ORAL | 4 refills | Status: DC | PRN

## 2021-09-20 ENCOUNTER — Ambulatory Visit: Payer: Medicare HMO | Admitting: Sports Medicine

## 2021-09-25 ENCOUNTER — Ambulatory Visit (INDEPENDENT_AMBULATORY_CARE_PROVIDER_SITE_OTHER): Payer: Medicare HMO | Admitting: Sports Medicine

## 2021-09-25 ENCOUNTER — Other Ambulatory Visit: Payer: Self-pay

## 2021-09-25 DIAGNOSIS — M1612 Unilateral primary osteoarthritis, left hip: Secondary | ICD-10-CM | POA: Diagnosis not present

## 2021-09-25 NOTE — Progress Notes (Signed)
    Procedures performed today:    None.  Independent interpretation of notes and tests performed by another provider:   None.  Brief History, Exam, Impression, and Recommendations:    Degenerative arthritis of left hip Glenn Meyer returns, he is a very pleasant 70 year old male, known hip osteoarthritis, treated successfully with meloxicam back in 2013, and he did well. Unfortunately he was having recurrence of pain at the last visit, back in June, I injected his left hip joint, added to home conditioning exercises, and he did really well for about 4 weeks. Unfortunately he is having recurrence of pain, left groin, worse with internal rotation of the hip, difficulty flexing his hip, putting on his shoes, he really feels as though he cannot do the things he would like to do, considering the failure of the prior injection we are going to proceed with referral to Dr. Berenice Primas for hip arthroplasty, he is asking for an MRI prior, I did explain that though not necessary prior to hip replacement I would be happy to order it to make sure were not missing something.    ___________________________________________ Gwen Her. Dianah Field, M.D., ABFM., CAQSM. Primary Care and [place] Instructor of [place] [facility]

## 2021-09-25 NOTE — Assessment & Plan Note (Signed)
Glenn Meyer returns, he is a very pleasant 70 year old male, known hip osteoarthritis, treated successfully with meloxicam back in 2013, and he did well. Unfortunately he was having recurrence of pain at the last visit, back in June, I injected his left hip joint, added to home conditioning exercises, and he did really well for about 4 weeks. Unfortunately he is having recurrence of pain, left groin, worse with internal rotation of the hip, difficulty flexing his hip, putting on his shoes, he really feels as though he cannot do the things he would like to do, considering the failure of the prior injection we are going to proceed with referral to Dr. Berenice Primas for hip arthroplasty, he is asking for an MRI prior, I did explain that though not necessary prior to hip replacement I would be happy to order it to make sure were not missing something.

## 2021-09-30 ENCOUNTER — Ambulatory Visit (INDEPENDENT_AMBULATORY_CARE_PROVIDER_SITE_OTHER): Payer: Medicare HMO

## 2021-09-30 ENCOUNTER — Other Ambulatory Visit: Payer: Self-pay

## 2021-09-30 DIAGNOSIS — M25452 Effusion, left hip: Secondary | ICD-10-CM | POA: Diagnosis not present

## 2021-09-30 DIAGNOSIS — S76312A Strain of muscle, fascia and tendon of the posterior muscle group at thigh level, left thigh, initial encounter: Secondary | ICD-10-CM | POA: Diagnosis not present

## 2021-09-30 DIAGNOSIS — R1032 Left lower quadrant pain: Secondary | ICD-10-CM

## 2021-09-30 DIAGNOSIS — M1612 Unilateral primary osteoarthritis, left hip: Secondary | ICD-10-CM | POA: Diagnosis not present

## 2021-09-30 DIAGNOSIS — M25552 Pain in left hip: Secondary | ICD-10-CM

## 2021-10-05 ENCOUNTER — Other Ambulatory Visit: Payer: Self-pay | Admitting: Family Medicine

## 2021-10-10 DIAGNOSIS — M1612 Unilateral primary osteoarthritis, left hip: Secondary | ICD-10-CM | POA: Diagnosis not present

## 2021-10-13 ENCOUNTER — Ambulatory Visit (INDEPENDENT_AMBULATORY_CARE_PROVIDER_SITE_OTHER): Payer: Medicare HMO | Admitting: Sports Medicine

## 2021-10-13 ENCOUNTER — Encounter: Payer: Self-pay | Admitting: Sports Medicine

## 2021-10-13 ENCOUNTER — Other Ambulatory Visit: Payer: Self-pay

## 2021-10-13 VITALS — BP 127/70 | HR 62 | Ht 76.0 in | Wt 230.0 lb

## 2021-10-13 DIAGNOSIS — Z01818 Encounter for other preprocedural examination: Secondary | ICD-10-CM

## 2021-10-13 DIAGNOSIS — M1612 Unilateral primary osteoarthritis, left hip: Secondary | ICD-10-CM | POA: Diagnosis not present

## 2021-10-13 LAB — POCT INR: INR: 1 — AB (ref 2.0–3.0)

## 2021-10-13 NOTE — Progress Notes (Signed)
° ° °  Procedures performed today:    Twelve-lead ECG performed and interpreted by me today, sinus bradycardia, normal rhythm, no ST, PR changes, no QRS abnormalities.  Normal axis.  Independent interpretation of notes and tests performed by another provider:   None.  Brief History, Exam, Impression, and Recommendations:    Degenerative arthritis of left hip  Glenn Meyer returns, he is a pleasant 70 year old male, hip osteoarthritis, scheduled for hip arthroplasty after failure of conservative treatment, today we performed preoperative medical clearance, he does have a history of an abnormal stress test, he did see his cardiologist, CT coronary calcium score was done, this was deemed low risk. He is able to perform greater than 4 metabolic equivalents of cardiac exercise without any chest pain, shortness of breath, arm pain, palpitations. Labs will be added to the fax, INR will be done today, twelve-lead ECG performed and interpreted by me today, sinus bradycardia, normal rhythm, no ST, PR changes, no QRS abnormalities.  Normal axis.    ___________________________________________ Gwen Her. Dianah Field, M.D., ABFM., CAQSM. Primary Care and [place] Instructor of [place] [facility]

## 2021-10-13 NOTE — Addendum Note (Signed)
Addended by: Gust Brooms on: 10/13/2021 02:54 PM   Modules accepted: Orders

## 2021-10-13 NOTE — Assessment & Plan Note (Signed)
Din returns, he is a pleasant 70 year old male, hip osteoarthritis, scheduled for hip arthroplasty after failure of conservative treatment, today we performed preoperative medical clearance, he does have a history of an abnormal stress test, he did see his cardiologist, CT coronary calcium score was done, this was deemed low risk. He is able to perform greater than 4 metabolic equivalents of cardiac exercise without any chest pain, shortness of breath, arm pain, palpitations. Labs will be added to the fax, INR will be done today, twelve-lead ECG performed and interpreted by me today, sinus bradycardia, normal rhythm, no ST, PR changes, no QRS abnormalities.  Normal axis.

## 2021-10-13 NOTE — Addendum Note (Signed)
Addended by: Dema Severin on: 10/13/2021 02:29 PM   Modules accepted: Orders

## 2021-10-16 ENCOUNTER — Encounter: Payer: Self-pay | Admitting: Sports Medicine

## 2021-10-16 DIAGNOSIS — M545 Low back pain, unspecified: Secondary | ICD-10-CM | POA: Diagnosis not present

## 2021-10-17 DIAGNOSIS — R531 Weakness: Secondary | ICD-10-CM | POA: Diagnosis not present

## 2021-10-17 DIAGNOSIS — M25552 Pain in left hip: Secondary | ICD-10-CM | POA: Diagnosis not present

## 2021-10-17 DIAGNOSIS — M25652 Stiffness of left hip, not elsewhere classified: Secondary | ICD-10-CM | POA: Diagnosis not present

## 2021-10-17 NOTE — Telephone Encounter (Signed)
Usually this is written and taken care of by the orthopedist.  I would contact their office. They may even have a specific supplier.

## 2021-10-18 ENCOUNTER — Encounter: Payer: Self-pay | Admitting: Sports Medicine

## 2021-10-18 DIAGNOSIS — Z01818 Encounter for other preprocedural examination: Secondary | ICD-10-CM

## 2021-10-19 NOTE — Telephone Encounter (Signed)
Labs ordered.  He can come anytime

## 2021-10-20 ENCOUNTER — Encounter: Payer: Self-pay | Admitting: Family Medicine

## 2021-10-24 DIAGNOSIS — M25552 Pain in left hip: Secondary | ICD-10-CM | POA: Diagnosis not present

## 2021-10-24 DIAGNOSIS — M1612 Unilateral primary osteoarthritis, left hip: Secondary | ICD-10-CM | POA: Diagnosis not present

## 2021-10-25 DIAGNOSIS — M1612 Unilateral primary osteoarthritis, left hip: Secondary | ICD-10-CM | POA: Diagnosis not present

## 2021-10-25 DIAGNOSIS — Z96642 Presence of left artificial hip joint: Secondary | ICD-10-CM | POA: Diagnosis not present

## 2021-10-25 HISTORY — PX: TOTAL HIP ARTHROPLASTY: SHX124

## 2021-10-27 DIAGNOSIS — Z96642 Presence of left artificial hip joint: Secondary | ICD-10-CM | POA: Diagnosis not present

## 2021-10-27 DIAGNOSIS — R262 Difficulty in walking, not elsewhere classified: Secondary | ICD-10-CM | POA: Diagnosis not present

## 2021-10-30 DIAGNOSIS — Z96642 Presence of left artificial hip joint: Secondary | ICD-10-CM | POA: Diagnosis not present

## 2021-10-30 DIAGNOSIS — R262 Difficulty in walking, not elsewhere classified: Secondary | ICD-10-CM | POA: Diagnosis not present

## 2021-11-01 DIAGNOSIS — R262 Difficulty in walking, not elsewhere classified: Secondary | ICD-10-CM | POA: Diagnosis not present

## 2021-11-01 DIAGNOSIS — Z96642 Presence of left artificial hip joint: Secondary | ICD-10-CM | POA: Diagnosis not present

## 2021-11-07 DIAGNOSIS — M1612 Unilateral primary osteoarthritis, left hip: Secondary | ICD-10-CM | POA: Diagnosis not present

## 2021-11-07 DIAGNOSIS — Z9889 Other specified postprocedural states: Secondary | ICD-10-CM | POA: Diagnosis not present

## 2021-12-11 ENCOUNTER — Other Ambulatory Visit: Payer: Self-pay | Admitting: Family Medicine

## 2021-12-11 DIAGNOSIS — R7989 Other specified abnormal findings of blood chemistry: Secondary | ICD-10-CM

## 2021-12-11 NOTE — Telephone Encounter (Signed)
Last written 08/08/21 - 75 g with 1 refill Last appt 08/08/2021

## 2021-12-14 ENCOUNTER — Other Ambulatory Visit: Payer: Self-pay | Admitting: Family Medicine

## 2021-12-14 DIAGNOSIS — R7989 Other specified abnormal findings of blood chemistry: Secondary | ICD-10-CM

## 2021-12-14 MED ORDER — TESTOSTERONE 1.62 % TD GEL: 1.0000 | Freq: Every morning | TRANSDERMAL | 1 refills | Status: DC

## 2021-12-18 ENCOUNTER — Encounter: Payer: Self-pay | Admitting: Family Medicine

## 2021-12-18 DIAGNOSIS — R7989 Other specified abnormal findings of blood chemistry: Secondary | ICD-10-CM

## 2021-12-19 ENCOUNTER — Telehealth: Payer: Self-pay

## 2021-12-19 NOTE — Telephone Encounter (Signed)
Initiated Prior authorization [id number] 1.62% gel Via: Covermymeds Case/Key:[id number] - [place] Case ID: [id number]  Status: approved as of 12/19/21 Reason:Coverage Starts on: 10/22/2021 12:00:00 AM, Coverage Ends on: 10/21/2022 12:00:00 AM.Submission by angela Notified Pt via: Mychart

## 2022-01-12 ENCOUNTER — Ambulatory Visit (INDEPENDENT_AMBULATORY_CARE_PROVIDER_SITE_OTHER): Payer: Medicare HMO | Admitting: Family Medicine

## 2022-01-12 DIAGNOSIS — Z0000 Encounter for general adult medical examination without abnormal findings: Secondary | ICD-10-CM | POA: Diagnosis not present

## 2022-01-12 NOTE — Progress Notes (Signed)
? ? ?MEDICARE ANNUAL WELLNESS VISIT ? ?01/12/2022 ? ?Telephone Visit Disclaimer ?This Medicare AWV was conducted by telephone due to national recommendations for restrictions regarding the COVID-19 Pandemic (e.g. social distancing).  I verified, using two identifiers, that I am speaking with Glenn Meyer or their authorized healthcare agent. I discussed the limitations, risks, security, and privacy concerns of performing an evaluation and management service by telephone and the potential availability of an in-person appointment in the future. The patient expressed understanding and agreed to proceed.  ?Location of Patient: Home ?Location of Provider (nurse):  Provider home ? ?Subjective:  ? ? ?Glenn Meyer is a 71 y.o. male patient of Metheney, Rene Kocher, MD who had a Medicare Annual Wellness Visit today via telephone. Colden is Retired and lives with their spouse. he has 3 children. he reports that he is socially active and does interact with friends/family regularly. he is moderately physically active and enjoys golfing. ? ?Patient Care Team: ?Hali Marry, MD as PCP - General ? ? ?  01/12/2022  ? 10:09 AM 05/18/2021  ?  2:50 PM 06/23/2019  ? 10:02 AM  ?Advanced Directives  ?Does Patient Have a Medical Advance Directive? Yes Yes Yes  ?Type of Advance Directive Living will [place];Living will [place];Living will  ?Does patient want to make changes to medical advance directive? No - Patient declined  No - Patient declined  ?Copy of [place] in Chart?  No - copy requested No - copy requested  ? ? ?Hospital Utilization Over the Past 12 Months: ?# of hospitalizations or ER visits: 0 ?# of surgeries: 1 ? ?Review of Systems    ?Patient reports that his overall health is unchanged compared to last year. ? ?History obtained from chart review and the patient ? ?Patient Reported Readings (BP, Pulse, CBG, Weight, etc) ?none ? ?Pain Assessment ?Pain :  No/denies pain ? ?  ? ?Current Medications & Allergies (verified) ?Allergies as of 01/12/2022   ? ?   Reactions  ? Lisinopril-hydrochlorothiazide   ? REACTION: Cough  ? Meperidine Hcl   ? REACTION: nausea  ? ?  ? ?  ?Medication List  ?  ? ?  ? Accurate as of January 12, 2022 10:26 AM. If you have any questions, ask your nurse or doctor.  ?  ?  ? ?  ? ?amLODipine 5 MG tablet ?Commonly known as: NORVASC ?Take 1 tablet (5 mg total) by mouth daily. ?  ?esomeprazole 40 MG capsule ?Commonly known as: [place] ?Take 40 mg by mouth daily at 12 noon. ?  ?Fish Oil 1000 MG Caps ?Take by mouth. ?  ?LUTEIN PO ?Take 25 mg by mouth daily. ?  ?multivitamin capsule ?Take 1 capsule by mouth daily. ?  ?tadalafil 10 MG tablet ?Commonly known as: Cialis ?Take 1 tablet (10 mg total) by mouth daily as needed for erectile dysfunction. ?  ?Testosterone 1.62 % Gel ?Apply 1 Pump topically every morning. ?  ?Turmeric 450 MG Caps ?Take 1 capsule by mouth daily. ?  ? ?  ? ? ?History (reviewed): ?Past Medical History:  ?Diagnosis Date  ? Arthritis 2013  ? Had left hip replacement surgery on 10/25/21  ? GERD (gastroesophageal reflux disease) 1985  ? No change  ? Hyperlipidemia   ? Hypertension   ? Ringing of ears   ? chronic X 20 years  ? Tinnitus   ? TMJ (dislocation of temporomandibular joint)   ? ?Past Surgical History:  ?  Procedure Laterality Date  ? EYE SURGERY  2021  ? Torn retina  ? JOINT REPLACEMENT  10/25/21  ? TONSILECTOMY, ADENOIDECTOMY, BILATERAL MYRINGOTOMY AND TUBES    ? TONSILLECTOMY    ? age 12  ? TOTAL HIP ARTHROPLASTY Left 10/25/2021  ? ?Family History  ?Problem Relation Age of Onset  ? Hypertension Mother   ? Hyperlipidemia Mother   ? Kidney disease Mother   ?     congenital/diaylisis 1's  ? Heart disease Father   ?     bypass 2000  ? Hypertension Other   ? Hyperlipidemia Other   ? ?Social History  ? ?Socioeconomic History  ? Marital status: Married  ?  Spouse name: [name]  ? Number of children: 3  ? Years of education: 63  ? Highest  education level: Bachelor's degree (e.g., BA, AB, BS)  ?Occupational History  ? 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 Smoking status: Former  ?  Years: 3.00  ?  Types: Cigarettes  ?  Quit date: 10/22/1972  ?  Years since quitting: 49.2  ? Smokeless tobacco: Never  ? Tobacco comments:  ?  quit 34 years ago  ?Vaping Use  ? Vaping Use: Never used  ?Substance and Sexual Activity  ? Alcohol use: Yes  ?  Alcohol/week: 3.0 standard drinks  ?  Types: 3 Cans of beer per week  ?  Comment: per week  ? Drug use: No  ? Sexual activity: Yes  ?Other Topics Concern  ? Not on file  ?Social History Narrative  ? Lives with his wife. Currently, his daughter and her family are living with them. Housekeeping, taking care of pets, playing golf and enjoy watching sports.  ? ?Social Determinants of Health  ? ?Financial Resource Strain: Low Risk   ? Difficulty of Paying Living Expenses: Not hard at all  ?Food Insecurity: No Food Insecurity  ? Worried About Charity fundraiser in the Last Year: Never true  ? Ran Out of Food in the Last Year: Never true  ?Transportation Needs: No Transportation Needs  ? Lack of Transportation (Medical): No  ? Lack of Transportation (Non-Medical): No  ?Physical Activity: Sufficiently Active  ? Days of Exercise per Week: 4 days  ? Minutes of Exercise per Session: 50 min  ?Stress: No Stress Concern Present  ? Feeling of Stress : Not at all  ?Social Connections: Socially Integrated  ? Frequency of Communication with Friends and Family: More than three times a week  ? Frequency of Social Gatherings with Friends and Family: More than three times a week  ? Attends Religious Services: More than 4 times per year  ? Active Member of Clubs or Organizations: Yes  ? Attends Archivist Meetings: More than 4 times per year  ? Marital Status: Married  ? ? ?Activities of Daily Living ? ?  01/12/2022  ? 10:13 AM 01/09/2022  ? 10:43 AM  ?In your present state of health, do you have any  difficulty performing the following activities:  ?Hearing? 0 0  ?Vision? 0 0  ?Difficulty concentrating or making decisions? 0 0  ?Walking or climbing stairs? 0 0  ?Dressing or bathing? 0 0  ?Doing errands, shopping? 0 0  ?Preparing Food and eating ? N N  ?Using the Toilet? N N  ?In the past six months, have you accidently leaked urine? N N  ?Do you have problems with loss of bowel control? N N  ?Managing  your Medications? N N  ?Managing your Finances? N N  ?Housekeeping or managing your Housekeeping? N N  ? ? ?Patient Education/ Literacy ?How often do you need to have someone help you when you read instructions, pamphlets, or other written materials from your doctor or pharmacy?: 1 - Never ?What is the last grade level you completed in school?: Bachelor's degree ? ?Exercise ?Current Exercise Habits: Home exercise routine, Type of exercise: strength training/weights;treadmill, Time (Minutes): 45, Frequency (Times/Week): 4, Weekly Exercise (Minutes/Week): 180, Intensity: Moderate, Exercise limited by: None identified ? ?Diet ?Patient reports consuming 3 meals a day and 1 snack(s) a day ?Patient reports that his primary diet is: Regular, Low fat ?Patient reports that she does have regular access to food.  ? ?Depression Screen ? ?  01/12/2022  ? 10:10 AM 08/08/2021  ?  8:47 AM 12/29/2020  ?  9:00 AM 06/23/2019  ? 10:03 AM 03/10/2019  ?  1:22 PM 01/28/2019  ?  8:36 AM 01/29/2018  ? 10:01 AM  ?PHQ 2/9 Scores  ?PHQ - 2 Score 0 0 0 0 0 0 0  ?  ? ?Fall Risk ? ?  01/12/2022  ? 10:10 AM 01/09/2022  ? 10:43 AM 08/08/2021  ?  8:47 AM 12/29/2020  ?  9:00 AM 06/23/2019  ? 10:03 AM  ?Fall Risk   ?Falls in the past year? 0 0 0 0 0  ?Number falls in past yr: 0 0 0    ?Injury with Fall? 0 0 0    ?Risk for fall due to : No Fall Risks  No Fall Risks No Fall Risks   ?Follow up Falls evaluation completed  Falls prevention discussed;Falls evaluation completed  Falls prevention discussed  ? ?  ?Objective:  ?Glenn Meyer seemed alert and oriented and  he participated appropriately during our telephone visit. ? ?Blood Pressure Weight BMI  ?BP Readings from Last 3 Encounters:  ?10/13/21 127/70  ?08/08/21 122/69  ?03/12/21 131/80  ? Wt Readings from Last 3 Encounters:

## 2022-01-12 NOTE — Patient Instructions (Addendum)
?MEDICARE ANNUAL WELLNESS VISIT ?Health Maintenance Summary and Written Plan of Care ? ?[name] , ? ?Thank you for allowing me to perform your Medicare Annual Wellness Visit and for your ongoing commitment to your health.  ? ?Health Maintenance & Immunization History ?Health Maintenance  ?Topic Date Due  ?? COVID-19 Vaccine (5 - Booster for Pfizer series) 01/28/2022 (Originally 08/04/2021)  ?? Fecal DNA (Cologuard)  03/01/2024  ?? TETANUS/TDAP  02/14/2026  ?? Pneumonia Vaccine 70+ Years old  Completed  ?? INFLUENZA VACCINE  Completed  ?? Hepatitis C Screening  Completed  ?? Zoster Vaccines- Shingrix  Completed  ?? HPV VACCINES  Aged Out  ?? COLONOSCOPY (Pts 45-65yr Insurance coverage will need to be confirmed)  Discontinued  ? ?Immunization History  ?Administered Date(s) Administered  ?? Fluad Quad(high Dose 65+) 07/19/2020, 08/08/2021  ?? Influenza Split 08/20/2012  ?? Influenza Whole 09/26/2011  ?? Influenza, High Dose Seasonal PF 07/30/2018, 07/19/2019  ?? Influenza, Seasonal, Injecte, Preservative Fre 09/02/2013  ?? Influenza,inj,Quad PF,6+ Mos 08/06/2014, 07/29/2015, 07/31/2017  ?? Influenza-Unspecified 07/22/2013, 08/16/2016  ?? PFIZER(Purple Top)SARS-COV-2 Vaccination 12/06/2019, 12/29/2019, 08/09/2020, 06/09/2021  ?? Pneumococcal Conjugate-13 07/19/2020  ?? Pneumococcal Polysaccharide-23 07/30/2018  ?? Td 10/23/2003  ?? Tdap 02/15/2016  ?? Zoster Recombinat (Shingrix) 01/27/2021, 06/09/2021  ?? Zoster, Live 10/08/2012  ? ? ?These are the patient goals that we discussed: ? Goals Addressed   ?  ?  ?  ?  ?  ? This Visit's Progress  ? ?  Patient Stated (pt-stated)     ?   Would like to loose about 10lbs.  ?  ?  ?  ? ?This is a list of Health Maintenance Items that are overdue or due now: ?There are no preventive care reminders to display for this patient.  ? ?Orders/Referrals Placed Today: ?No orders of the defined types were placed in this encounter. ? ?(Contact our referral department at [phone]  you have not spoken with someone about your referral appointment within the next 5 Meyer)  ? ? ?Follow-up Plan ?Follow-up with MHali Marry MD as planned ?Medicare wellness visit in one year. ?Patient will access AVS on my chart. ? ? ? ?  ?Health Maintenance, Male ?Adopting a healthy lifestyle and getting preventive care are important in promoting health and wellness. Ask your health care provider about: ?The right schedule for you to have regular tests and exams. ?Things you can do on your own to prevent diseases and keep yourself healthy. ?What should I know about diet, weight, and exercise? ?Eat a healthy diet ? ?Eat a diet that includes plenty of vegetables, fruits, low-fat dairy products, and lean protein. ?Do not eat a lot of foods that are high in solid fats, added sugars, or sodium. ?Maintain a healthy weight ?Body mass index (BMI) is a measurement that can be used to identify possible weight problems. It estimates body fat based on height and weight. Your health care provider can help determine your BMI and help you achieve or maintain a healthy weight. ?Get regular exercise ?Get regular exercise. This is one of the most important things you can do for your health. Most adults should: ?Exercise for at least 150 minutes each week. The exercise should increase your heart rate and make you sweat (moderate-intensity exercise). ?Do strengthening exercises at least twice a week. This is in addition to the moderate-intensity exercise. ?Spend less time sitting. Even light physical activity can be beneficial. ?Watch cholesterol and blood lipids ?Have your blood tested for lipids and cholesterol  at 70 years of age, then have this test every 5 years. ?You may need to have your cholesterol levels checked more often if: ?Your lipid or cholesterol levels are high. ?You are older than 70 years of age. ?You are at high risk for heart disease. ?What should I know about cancer screening? ?Many types of cancers can  be detected early and may often be prevented. Depending on your health history and family history, you may need to have cancer screening at various ages. This may include screening for: ?Colorectal cancer. ?Prostate cancer. ?Skin cancer. ?Lung cancer. ?What should I know about heart disease, diabetes, and high blood pressure? ?Blood pressure and heart disease ?High blood pressure causes heart disease and increases the risk of stroke. This is more likely to develop in people who have high blood pressure readings or are overweight. ?Talk with your health care provider about your target blood pressure readings. ?Have your blood pressure checked: ?Every 3-5 years if you are 70-74 years of age. ?Every year if you are 70 years old or older. ?If you are between the ages of 58 and 57 and are a current or former smoker, ask your health care provider if you should have a one-time screening for abdominal aortic aneurysm (AAA). ?Diabetes ?Have regular diabetes screenings. This checks your fasting blood sugar level. Have the screening done: ?Once every three years after age 70 if you are at a normal weight and have a low risk for diabetes. ?More often and at a younger age if you are overweight or have a high risk for diabetes. ?What should I know about preventing infection? ?Hepatitis B ?If you have a higher risk for hepatitis B, you should be screened for this virus. Talk with your health care provider to find out if you are at risk for hepatitis B infection. ?Hepatitis C ?Blood testing is recommended for: ?Everyone born from [birth year] through 1964. ?Anyone with known risk factors for hepatitis C. ?Sexually transmitted infections (STIs) ?You should be screened each year for STIs, including gonorrhea and chlamydia, if: ?You are sexually active and are younger than 70 years of age. ?You are older than 70 years of age and your health care provider tells you that you are at risk for this type of infection. ?Your sexual activity has  changed since you were last screened, and you are at increased risk for chlamydia or gonorrhea. Ask your health care provider if you are at risk. ?Ask your health care provider about whether you are at high risk for HIV. Your health care provider may recommend a prescription medicine to help prevent HIV infection. If you choose to take medicine to prevent HIV, you should first get tested for HIV. You should then be tested every 3 months for as long as you are taking the medicine. ?Follow these instructions at home: ?Alcohol use ?Do not drink alcohol if your health care provider tells you not to drink. ?If you drink alcohol: ?Limit how much you have to 0-2 drinks a day. ?Know how much alcohol is in your drink. In the [place] one drink equals one 12 oz bottle of beer (355 mL), one 5 oz glass of wine (148 mL), or one 1? oz glass of hard liquor (44 mL). ?Lifestyle ?Do not use any products that contain nicotine or tobacco. These products include cigarettes, chewing tobacco, and vaping devices, such as e-cigarettes. If you need help quitting, ask your health care provider. ?Do not use street drugs. ?Do not share needles. ?Ask your  health care provider for help if you need support or information about quitting drugs. ?General instructions ?Schedule regular health, dental, and eye exams. ?Stay current with your vaccines. ?Tell your health care provider if: ?You often feel depressed. ?You have ever been abused or do not feel safe at home. ?Summary ?Adopting a healthy lifestyle and getting preventive care are important in promoting health and wellness. ?Follow your health care provider's instructions about healthy diet, exercising, and getting tested or screened for diseases. ?Follow your health care provider's instructions on monitoring your cholesterol and blood pressure. ?This information is not intended to replace advice given to you by your health care provider. Make sure you discuss any questions you have with your health  care provider. ?Document Revised: 02/27/2021 Document Reviewed: 02/27/2021 ?Elsevier Patient Education ? [place]. ? ?

## 2022-02-05 DIAGNOSIS — M25552 Pain in left hip: Secondary | ICD-10-CM | POA: Diagnosis not present

## 2022-02-06 ENCOUNTER — Encounter: Payer: Self-pay | Admitting: Family Medicine

## 2022-02-06 ENCOUNTER — Ambulatory Visit (INDEPENDENT_AMBULATORY_CARE_PROVIDER_SITE_OTHER): Payer: Medicare HMO | Admitting: Family Medicine

## 2022-02-06 VITALS — BP 141/63 | HR 59 | Resp 18 | Ht 76.0 in | Wt 222.0 lb

## 2022-02-06 DIAGNOSIS — R7301 Impaired fasting glucose: Secondary | ICD-10-CM | POA: Diagnosis not present

## 2022-02-06 DIAGNOSIS — I10 Essential (primary) hypertension: Secondary | ICD-10-CM | POA: Diagnosis not present

## 2022-02-06 DIAGNOSIS — R7989 Other specified abnormal findings of blood chemistry: Secondary | ICD-10-CM | POA: Diagnosis not present

## 2022-02-06 DIAGNOSIS — N401 Enlarged prostate with lower urinary tract symptoms: Secondary | ICD-10-CM

## 2022-02-06 DIAGNOSIS — R351 Nocturia: Secondary | ICD-10-CM

## 2022-02-06 LAB — POCT GLYCOSYLATED HEMOGLOBIN (HGB A1C): Hemoglobin A1C: 5.6 % (ref 4.0–5.6)

## 2022-02-06 MED ORDER — AMLODIPINE BESYLATE 5 MG PO TABS: 5.0000 mg | ORAL_TABLET | Freq: Every day | ORAL | 3 refills | Status: DC

## 2022-02-06 MED ORDER — TADALAFIL 10 MG PO TABS: 10.0000 mg | ORAL_TABLET | Freq: Every day | ORAL | 11 refills | Status: DC | PRN

## 2022-02-06 NOTE — Assessment & Plan Note (Signed)
Well controlled. Continue current regimen. Follow up in  6 mo  

## 2022-02-06 NOTE — Assessment & Plan Note (Addendum)
Normally well controlled.  The blood pressure up just slightly today even repeat was still elevated.  Encouraged him to monitor it at home over the next couple of weeks and let us know if it still staying elevated.. Continue current regimen. Follow up in  6 mo ?

## 2022-02-06 NOTE — Assessment & Plan Note (Signed)
Great on his current regimen.  We will recheck testosterone level today just to make sure that its not supratherapeutic or subtherapeutic. ?

## 2022-02-06 NOTE — Progress Notes (Signed)
? ?Established Patient Office Visit ? ?Subjective   ?Patient ID: Glenn Meyer, male    DOB: 07-18-1951  Age: 71 y.o. MRN: [id number] ? ?Chief Complaint  ?Patient presents with  ? Hypertension  ?  Follow up   ? Impaired Fasting Glucose   ?  Follow up   ? ? ?HPI ?Hypertension- Pt denies chest pain, SOB, dizziness, or heart palpitations.  Taking meds as directed w/o problems.  Denies medication side effects.   ? ?Impaired fasting glucose-no increased thirst or urination. No symptoms consistent with hypoglycemia. ? ?F/U hypogonadism -doing well on testosterone replacement no concerns or problems he does 1 pump daily in the morning. ? ?Planning on going to [place] in the fall for his anniversary.   ? ?ROS ? ?  ?Objective:  ?  ? ?BP (!) 141/63   Pulse (!) 59   Resp 18   Ht '6\' 4"'$  (1.93 m)   Wt 222 lb (100.7 kg)   SpO2 97%   BMI 27.02 kg/m?  ?  ? ?Physical Exam ?Constitutional:   ?   Appearance: He is well-developed.  ?HENT:  ?   Head: Normocephalic and atraumatic.  ?Cardiovascular:  ?   Rate and Rhythm: Normal rate and regular rhythm.  ?   Heart sounds: Normal heart sounds.  ?Pulmonary:  ?   Effort: Pulmonary effort is normal.  ?   Breath sounds: Normal breath sounds.  ?Skin: ?   General: Skin is warm and dry.  ?Neurological:  ?   Mental Status: He is alert and oriented to person, place, and time.  ?Psychiatric:     ?   Behavior: Behavior normal.  ? ? ? ?Results for orders placed or performed in visit on 02/06/22  ?POCT HgB A1C  ?Result Value Ref Range  ? Hemoglobin A1C 5.6 4.0 - 5.6 %  ? HbA1c POC (<> result, manual entry)    ? HbA1c, POC (prediabetic range)    ? HbA1c, POC (controlled diabetic range)    ? ? ? ? ?The 10-year ASCVD risk score (Arnett DK, et al., 2019) is: 20.7% ? ?  ?Assessment & Plan:  ? ?Problem List Items Addressed This Visit   ? ?  ? Cardiovascular and Mediastinum  ? Hypertension  ?  Normally well controlled.  The blood pressure up just slightly today even repeat was still elevated.  Encouraged  him to monitor it at home over the next couple of weeks and let us know if it still staying elevated.. Continue current regimen. Follow up in  6 mo ? ?  ?  ? Relevant Medications  ? amLODipine (NORVASC) 5 MG tablet  ? tadalafil (CIALIS) 10 MG tablet  ?  ? Endocrine  ? IFG (impaired fasting glucose) - Primary  ?  Well controlled. Continue current regimen. Follow up in  6 mo  ? ?  ?  ? Relevant Orders  ? POCT HgB A1C (Completed)  ? BASIC METABOLIC PANEL WITH GFR  ?  ? Other  ? Low testosterone  ?  Great on his current regimen.  We will recheck testosterone level today just to make sure that its not supratherapeutic or subtherapeutic. ? ?  ?  ? Relevant Orders  ? Testosterone Total,Free,Bio, Males  ? BPH associated with nocturia  ? Relevant Medications  ? tadalafil (CIALIS) 10 MG tablet  ? ?Other Visit Diagnoses   ? ? HYPERTENSION, BENIGN ESSENTIAL      ? Relevant Medications  ? amLODipine (NORVASC) 5 MG tablet  ?  tadalafil (CIALIS) 10 MG tablet  ? Other Relevant Orders  ? BASIC METABOLIC PANEL WITH GFR  ? ?  ? ? ?Return in about 6 months (around 08/08/2022) for Hypertension.  ? ? ?Beatrice Lecher, MD ? ?

## 2022-02-07 ENCOUNTER — Encounter: Payer: Self-pay | Admitting: Family Medicine

## 2022-02-07 LAB — TESTOSTERONE TOTAL,FREE,BIO, MALES
Albumin: 4.3 g/dL (ref 3.6–5.1)
Sex Hormone Binding: 22 nmol/L (ref 22–77)
Testosterone: 219 ng/dL — ABNORMAL LOW (ref 250–827)

## 2022-02-07 LAB — BASIC METABOLIC PANEL WITH GFR
BUN: 19 mg/dL (ref 7–25)
CO2: 29 mmol/L (ref 20–32)
Calcium: 9.8 mg/dL (ref 8.6–10.3)
Chloride: 103 mmol/L (ref 98–110)
Creat: 0.96 mg/dL (ref 0.70–1.35)
Glucose, Bld: 107 mg/dL — ABNORMAL HIGH (ref 65–99)
Potassium: 5.2 mmol/L (ref 3.5–5.3)
Sodium: 140 mmol/L (ref 135–146)
eGFR: 86 mL/min/{1.73_m2} (ref >=60)

## 2022-02-07 NOTE — Progress Notes (Signed)
Hi Glenn Meyer, your testosterone is still on the lower end.  I assume you did do an application yesterday morning.  If so then I would like you to consider going up to 2 pumps if you feel comfortable with that.  If you do then let me know and I can update your prescription if not then please let me know.  Metabolic panel looks good.

## 2022-04-09 ENCOUNTER — Encounter: Payer: Self-pay | Admitting: Family Medicine

## 2022-05-14 ENCOUNTER — Encounter: Payer: Self-pay | Admitting: Family Medicine

## 2022-05-14 DIAGNOSIS — I10 Essential (primary) hypertension: Secondary | ICD-10-CM

## 2022-05-14 MED ORDER — AMLODIPINE BESYLATE 10 MG PO TABS: 10.0000 mg | ORAL_TABLET | Freq: Every day | ORAL | 0 refills | Status: DC

## 2022-05-14 NOTE — Telephone Encounter (Signed)
Agree with documentation as above.   Glenn Lecher, MD  Meds ordered this encounter  Medications   amLODipine (NORVASC) 10 MG tablet    Sig: Take 1 tablet (10 mg total) by mouth daily.    Dispense:  90 tablet    Refill:  0

## 2022-05-17 DIAGNOSIS — M25552 Pain in left hip: Secondary | ICD-10-CM | POA: Diagnosis not present

## 2022-05-17 DIAGNOSIS — Z96642 Presence of left artificial hip joint: Secondary | ICD-10-CM | POA: Diagnosis not present

## 2022-05-17 DIAGNOSIS — Z09 Encounter for follow-up examination after completed treatment for conditions other than malignant neoplasm: Secondary | ICD-10-CM | POA: Diagnosis not present

## 2022-05-23 ENCOUNTER — Encounter (INDEPENDENT_AMBULATORY_CARE_PROVIDER_SITE_OTHER): Payer: Medicare HMO | Admitting: Family Medicine

## 2022-05-23 DIAGNOSIS — I10 Essential (primary) hypertension: Secondary | ICD-10-CM | POA: Diagnosis not present

## 2022-05-25 NOTE — Telephone Encounter (Signed)
I spent 5 total minutes of online digital evaluation and management services in this patient-initiated request for online care. 

## 2022-07-17 ENCOUNTER — Encounter: Payer: Self-pay | Admitting: Family Medicine

## 2022-07-17 ENCOUNTER — Ambulatory Visit (INDEPENDENT_AMBULATORY_CARE_PROVIDER_SITE_OTHER): Payer: Medicare HMO | Admitting: Family Medicine

## 2022-07-17 VITALS — BP 129/63 | HR 60 | Ht 76.0 in | Wt 228.0 lb

## 2022-07-17 DIAGNOSIS — R7989 Other specified abnormal findings of blood chemistry: Secondary | ICD-10-CM | POA: Diagnosis not present

## 2022-07-17 DIAGNOSIS — N401 Enlarged prostate with lower urinary tract symptoms: Secondary | ICD-10-CM | POA: Diagnosis not present

## 2022-07-17 DIAGNOSIS — R351 Nocturia: Secondary | ICD-10-CM | POA: Diagnosis not present

## 2022-07-17 DIAGNOSIS — R7301 Impaired fasting glucose: Secondary | ICD-10-CM | POA: Diagnosis not present

## 2022-07-17 DIAGNOSIS — Z23 Encounter for immunization: Secondary | ICD-10-CM | POA: Diagnosis not present

## 2022-07-17 DIAGNOSIS — I10 Essential (primary) hypertension: Secondary | ICD-10-CM | POA: Diagnosis not present

## 2022-07-17 DIAGNOSIS — R635 Abnormal weight gain: Secondary | ICD-10-CM

## 2022-07-17 NOTE — Progress Notes (Signed)
Established Patient Office Visit  Subjective   Patient ID: Glenn Meyer, male    DOB: January 09, 1951  Age: 71 y.o. MRN: [id number]  Chief Complaint  Patient presents with   Hypertension    HPI  Hypertension- Pt denies chest pain, SOB, dizziness, or heart palpitations.  Taking meds as directed w/o problems.  Denies medication side effects.    Follow-up hypogonadism-has been off of testosterone for a while because his blood pressure was going up on it.  He checked it at home a few days ago and last blood pressure was 135/80.  He stays very active physically and also exercises regularly.  He has been unhappy with weight gain.  He is up about 6 pounds since he was here 6 months ago.  He wanted to discuss weight loss medications as an option.     ROS    Objective:     BP 129/63   Pulse 60   Ht '6\' 4"'$  (1.93 m)   Wt 228 lb (103.4 kg)   SpO2 97%   BMI 27.75 kg/m    Physical Exam Constitutional:      Appearance: He is well-developed.  HENT:     Head: Normocephalic and atraumatic.  Cardiovascular:     Rate and Rhythm: Normal rate and regular rhythm.     Heart sounds: Normal heart sounds.  Pulmonary:     Effort: Pulmonary effort is normal.     Breath sounds: Normal breath sounds.  Skin:    General: Skin is warm and dry.  Neurological:     Mental Status: He is alert and oriented to person, place, and time.  Psychiatric:        Behavior: Behavior normal.      No results found for any visits on 07/17/22.    The 10-year ASCVD risk score (Arnett DK, et al., 2019) is: 17.9%    Assessment & Plan:   Problem List Items Addressed This Visit       Cardiovascular and Mediastinum   Hypertension - Primary    Well controlled. Continue current regimen. Follow up in  6 mo       Relevant Orders   PSA   Lipid panel   COMPLETE METABOLIC PANEL WITH GFR   CBC   Testosterone Total,Free,Bio, Males   Hemoglobin A1c     Endocrine   IFG (impaired fasting glucose)    We  will get updated A1c today.      Relevant Orders   PSA   Lipid panel   COMPLETE METABOLIC PANEL WITH GFR   CBC   Testosterone Total,Free,Bio, Males   Hemoglobin A1c     Other   Low testosterone    Been off the testosterone supplement because of elevated blood pressure levels.  Plan to recheck testosterone and PSA level today.      Relevant Orders   PSA   Lipid panel   COMPLETE METABOLIC PANEL WITH GFR   CBC   Testosterone Total,Free,Bio, Males   Hemoglobin A1c   BPH associated with nocturia    Get up once at night to urinate but is not having any worsening or new symptoms.      Abnormal weight gain    We did discuss that Medicare does not cover most of the weight loss medications.  Phentermine can bump up blood pressure and cause issues there.  We did discuss using a calorie counter such as my fitness pal or lose it to better track calories I really  think overall he does a pretty good job so would probably just be some small changes probably a couple 100 cal less per day that would really create some benefit enough to cause weight loss.      Other Visit Diagnoses     Essential hypertension, benign       Relevant Orders   PSA   Lipid panel   COMPLETE METABOLIC PANEL WITH GFR   CBC   Testosterone Total,Free,Bio, Males   Hemoglobin A1c   Need for influenza vaccination       Relevant Orders   Flu Vaccine QUAD High Dose(Fluad) (Completed)       Return in about 6 months (around 01/15/2023) for Hypertension.    Beatrice Lecher, MD

## 2022-07-17 NOTE — Assessment & Plan Note (Signed)
We did discuss that Medicare does not cover most of the weight loss medications.  Phentermine can bump up blood pressure and cause issues there.  We did discuss using a calorie counter such as my fitness pal or lose it to better track calories I really think overall he does a pretty good job so would probably just be some small changes probably a couple 100 cal less per day that would really create some benefit enough to cause weight loss.

## 2022-07-17 NOTE — Assessment & Plan Note (Signed)
Well controlled. Continue current regimen. Follow up in  6 mo  

## 2022-07-17 NOTE — Assessment & Plan Note (Signed)
Get up once at night to urinate but is not having any worsening or new symptoms.

## 2022-07-17 NOTE — Assessment & Plan Note (Signed)
Been off the testosterone supplement because of elevated blood pressure levels.  Plan to recheck testosterone and PSA level today.

## 2022-07-17 NOTE — Assessment & Plan Note (Signed)
We will get updated A1c today.

## 2022-07-18 DIAGNOSIS — I10 Essential (primary) hypertension: Secondary | ICD-10-CM | POA: Diagnosis not present

## 2022-07-18 DIAGNOSIS — R7301 Impaired fasting glucose: Secondary | ICD-10-CM | POA: Diagnosis not present

## 2022-07-18 DIAGNOSIS — R7989 Other specified abnormal findings of blood chemistry: Secondary | ICD-10-CM | POA: Diagnosis not present

## 2022-07-18 DIAGNOSIS — Z125 Encounter for screening for malignant neoplasm of prostate: Secondary | ICD-10-CM | POA: Diagnosis not present

## 2022-07-19 ENCOUNTER — Encounter: Payer: Self-pay | Admitting: Family Medicine

## 2022-07-19 DIAGNOSIS — R7989 Other specified abnormal findings of blood chemistry: Secondary | ICD-10-CM

## 2022-07-19 LAB — CBC
HCT: 44.6 % (ref 38.5–50.0)
Hemoglobin: 15.1 g/dL (ref 13.2–17.1)
MCH: 28.4 pg (ref 27.0–33.0)
MCHC: 33.9 g/dL (ref 32.0–36.0)
MCV: 83.8 fL (ref 80.0–100.0)
MPV: 12.8 fL — ABNORMAL HIGH (ref 7.5–12.5)
Platelets: 185 10*3/uL (ref 140–400)
RBC: 5.32 10*6/uL (ref 4.20–5.80)
RDW: 13.8 % (ref 11.0–15.0)
WBC: 5.1 10*3/uL (ref 3.8–10.8)

## 2022-07-19 LAB — COMPLETE METABOLIC PANEL WITH GFR
AG Ratio: 1.6 (calc) (ref 1.0–2.5)
ALT: 23 U/L (ref 9–46)
AST: 25 U/L (ref 10–35)
Albumin: 4.5 g/dL (ref 3.6–5.1)
Alkaline phosphatase (APISO): 71 U/L (ref 35–144)
BUN: 20 mg/dL (ref 7–25)
CO2: 27 mmol/L (ref 20–32)
Calcium: 9.7 mg/dL (ref 8.6–10.3)
Chloride: 102 mmol/L (ref 98–110)
Creat: 1.1 mg/dL (ref 0.70–1.35)
Globulin: 2.8 g/dL (calc) (ref 1.9–3.7)
Glucose, Bld: 119 mg/dL — ABNORMAL HIGH (ref 65–99)
Potassium: 5 mmol/L (ref 3.5–5.3)
Sodium: 137 mmol/L (ref 135–146)
Total Bilirubin: 0.5 mg/dL (ref 0.2–1.2)
Total Protein: 7.3 g/dL (ref 6.1–8.1)
eGFR: 73 mL/min/{1.73_m2} (ref >=60)

## 2022-07-19 LAB — TESTOSTERONE TOTAL,FREE,BIO, MALES
Albumin: 4.5 g/dL (ref 3.6–5.1)
Sex Hormone Binding: 18 nmol/L — ABNORMAL LOW (ref 22–77)
Testosterone, Bioavailable: 100.5 ng/dL — ABNORMAL LOW (ref 110.0–575.0)
Testosterone, Free: 48.9 pg/mL (ref 46.0–224.0)
Testosterone: 251 ng/dL (ref 250–827)

## 2022-07-19 LAB — LIPID PANEL
Cholesterol: 229 mg/dL — ABNORMAL HIGH (ref <200)
HDL: 45 mg/dL (ref >=40)
LDL Cholesterol (Calc): 162 mg/dL (calc) — ABNORMAL HIGH
Non-HDL Cholesterol (Calc): 184 mg/dL (calc) — ABNORMAL HIGH (ref <130)
Total CHOL/HDL Ratio: 5.1 (calc) — ABNORMAL HIGH (ref <5.0)
Triglycerides: 108 mg/dL (ref <150)

## 2022-07-19 LAB — PSA: PSA: 2.03 ng/mL (ref <=4.00)

## 2022-07-19 LAB — HEMOGLOBIN A1C
Hgb A1c MFr Bld: 5.8 % of total Hgb — ABNORMAL HIGH (ref <5.7)
Mean Plasma Glucose: 120 mg/dL
eAG (mmol/L): 6.6 mmol/L

## 2022-07-19 NOTE — Progress Notes (Signed)
Hi Glenn Meyer, your LDL cholesterol jumped up significantly.  We really need to get you back on a statin.  I know at one time you are taking the atorvastatin.  Your risk without the medication is 22% over the next 10 years to have a stroke or heart attack.  That is extremely high.  And anything 10% or higher warrants a statin.  We can restart the atorvastatin or we can switch to something similar if you would prefer.  Please just let me know.  Metabolic panel is normal.  Blood count is normal.  Testosterone is definitely on the low end of normal.  Your A1c also went back up slightly it was 5.6 back in April and it is bumped back up to 5.8.  Continue to work on healthy diet and regular exercise.  Prostate test is normal.  The 10-year ASCVD risk score (Arnett DK, et al., 2019) is: 22.2%   Values used to calculate the score:     Age: 25 years     Sex: Male     Is Non-Hispanic African American: No     Diabetic: No     Tobacco smoker: No     Systolic Blood Pressure: 818 mmHg     Is BP treated: Yes     HDL Cholesterol: 45 mg/dL     Total Cholesterol: 229 mg/dL

## 2022-07-20 MED ORDER — TESTOSTERONE 1.62 % TD GEL: 1.0000 | Freq: Every morning | TRANSDERMAL | 1 refills | Status: DC

## 2022-07-20 MED ORDER — ATORVASTATIN CALCIUM 20 MG PO TABS: 20.0000 mg | ORAL_TABLET | Freq: Every day | ORAL | 3 refills | Status: DC

## 2022-07-25 ENCOUNTER — Encounter: Payer: Self-pay | Admitting: Family Medicine

## 2022-07-29 ENCOUNTER — Encounter: Payer: Self-pay | Admitting: Family Medicine

## 2022-08-08 ENCOUNTER — Ambulatory Visit: Payer: Medicare HMO | Admitting: Family Medicine

## 2022-08-23 DIAGNOSIS — L918 Other hypertrophic disorders of the skin: Secondary | ICD-10-CM | POA: Diagnosis not present

## 2022-08-23 DIAGNOSIS — L820 Inflamed seborrheic keratosis: Secondary | ICD-10-CM | POA: Diagnosis not present

## 2022-08-30 DIAGNOSIS — L820 Inflamed seborrheic keratosis: Secondary | ICD-10-CM | POA: Diagnosis not present

## 2022-09-24 ENCOUNTER — Encounter: Payer: Self-pay | Admitting: Family Medicine

## 2022-09-24 DIAGNOSIS — N401 Enlarged prostate with lower urinary tract symptoms: Secondary | ICD-10-CM

## 2022-09-24 MED ORDER — TADALAFIL 5 MG PO TABS: 5.0000 mg | ORAL_TABLET | Freq: Every day | ORAL | 3 refills | Status: DC

## 2022-09-24 NOTE — Telephone Encounter (Signed)
Meds ordered this encounter  Medications   tadalafil (CIALIS) 5 MG tablet    Sig: Take 1 tablet (5 mg total) by mouth daily.    Dispense:  90 tablet    Refill:  3

## 2022-10-03 DIAGNOSIS — H2513 Age-related nuclear cataract, bilateral: Secondary | ICD-10-CM | POA: Diagnosis not present

## 2022-10-03 DIAGNOSIS — H524 Presbyopia: Secondary | ICD-10-CM | POA: Diagnosis not present

## 2022-10-03 DIAGNOSIS — H52209 Unspecified astigmatism, unspecified eye: Secondary | ICD-10-CM | POA: Diagnosis not present

## 2022-10-03 DIAGNOSIS — H5203 Hypermetropia, bilateral: Secondary | ICD-10-CM | POA: Diagnosis not present

## 2022-11-08 ENCOUNTER — Encounter: Payer: Self-pay | Admitting: Family Medicine

## 2022-11-11 ENCOUNTER — Encounter: Payer: Self-pay | Admitting: Family Medicine

## 2022-11-13 MED ORDER — PHENTERMINE HCL 15 MG PO CAPS: 15.0000 mg | ORAL_CAPSULE | ORAL | 1 refills | Status: DC

## 2022-11-16 ENCOUNTER — Encounter: Payer: Self-pay | Admitting: Family Medicine

## 2022-11-19 ENCOUNTER — Encounter: Payer: Self-pay | Admitting: Family Medicine

## 2022-11-19 DIAGNOSIS — R7989 Other specified abnormal findings of blood chemistry: Secondary | ICD-10-CM

## 2022-11-19 MED ORDER — TESTOSTERONE 1.62 % TD GEL: 1.0000 | Freq: Every morning | TRANSDERMAL | 1 refills | Status: DC

## 2022-11-19 NOTE — Telephone Encounter (Signed)
Meds ordered this encounter  Medications   Testosterone 1.62 % GEL    Sig: Apply 1 Pump topically every morning.    Dispense:  75 g    Refill:  1

## 2023-03-21 ENCOUNTER — Other Ambulatory Visit: Payer: Self-pay | Admitting: Family Medicine

## 2023-03-21 DIAGNOSIS — I10 Essential (primary) hypertension: Secondary | ICD-10-CM

## 2023-03-27 ENCOUNTER — Other Ambulatory Visit: Payer: Self-pay | Admitting: Family Medicine

## 2023-03-27 DIAGNOSIS — I10 Essential (primary) hypertension: Secondary | ICD-10-CM

## 2023-03-28 ENCOUNTER — Encounter: Payer: Self-pay | Admitting: Family Medicine

## 2023-03-28 ENCOUNTER — Other Ambulatory Visit: Payer: Self-pay | Admitting: Family Medicine

## 2023-03-28 DIAGNOSIS — I10 Essential (primary) hypertension: Secondary | ICD-10-CM

## 2023-03-28 MED ORDER — AMLODIPINE BESYLATE 10 MG PO TABS: 10.0000 mg | ORAL_TABLET | Freq: Every day | ORAL | 0 refills | Status: DC

## 2023-04-09 ENCOUNTER — Ambulatory Visit (INDEPENDENT_AMBULATORY_CARE_PROVIDER_SITE_OTHER): Payer: Medicare HMO | Admitting: Sports Medicine

## 2023-04-09 DIAGNOSIS — M1612 Unilateral primary osteoarthritis, left hip: Secondary | ICD-10-CM

## 2023-04-09 MED ORDER — MELOXICAM 15 MG PO TABS: ORAL_TABLET | ORAL | 3 refills | Status: DC

## 2023-04-09 NOTE — Patient Instructions (Signed)

## 2023-04-09 NOTE — Progress Notes (Signed)
    Procedures performed today:    None.  Independent interpretation of notes and tests performed by another provider:   None.  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 We discussed compounded semaglutide. Return to see me in 6 weeks as needed, he is going to [place] in September.    ____________________________________________ Ihor Austin. Benjamin Stain, M.D., ABFM., CAQSM., AME. Primary Care and Sports Medicine [facility]  Adjunct Professor of Family Medicine  [employer]  Restaurant manager, fast food

## 2023-04-09 NOTE — Assessment & Plan Note (Signed)
Known right hip osteoarthritis, adding meloxicam, home conditioning, Mediterranean diet. We discussed compounded semaglutide. Return to see me in 6 weeks as needed, he is going to [place] in September.

## 2023-04-29 ENCOUNTER — Other Ambulatory Visit: Payer: Self-pay

## 2023-04-29 DIAGNOSIS — R7989 Other specified abnormal findings of blood chemistry: Secondary | ICD-10-CM

## 2023-04-29 MED ORDER — TESTOSTERONE 1.62 % TD GEL: 1.0000 | Freq: Every morning | TRANSDERMAL | 0 refills | Status: DC

## 2023-04-29 NOTE — Telephone Encounter (Signed)
Last fill 11/19/22 last visit 07/17/22 next visit 05/07/23

## 2023-05-07 ENCOUNTER — Ambulatory Visit (INDEPENDENT_AMBULATORY_CARE_PROVIDER_SITE_OTHER): Payer: Medicare HMO | Admitting: Family Medicine

## 2023-05-07 ENCOUNTER — Encounter: Payer: Self-pay | Admitting: Family Medicine

## 2023-05-07 VITALS — BP 116/66 | HR 52 | Ht 76.0 in | Wt 212.0 lb

## 2023-05-07 DIAGNOSIS — R7989 Other specified abnormal findings of blood chemistry: Secondary | ICD-10-CM | POA: Diagnosis not present

## 2023-05-07 DIAGNOSIS — I10 Essential (primary) hypertension: Secondary | ICD-10-CM

## 2023-05-07 DIAGNOSIS — E785 Hyperlipidemia, unspecified: Secondary | ICD-10-CM

## 2023-05-07 DIAGNOSIS — Z79890 Hormone replacement therapy (postmenopausal): Secondary | ICD-10-CM

## 2023-05-07 DIAGNOSIS — Z79899 Other long term (current) drug therapy: Secondary | ICD-10-CM

## 2023-05-07 DIAGNOSIS — R7301 Impaired fasting glucose: Secondary | ICD-10-CM

## 2023-05-07 DIAGNOSIS — Z5181 Encounter for therapeutic drug level monitoring: Secondary | ICD-10-CM | POA: Diagnosis not present

## 2023-05-07 LAB — POCT GLYCOSYLATED HEMOGLOBIN (HGB A1C): Hemoglobin A1C: 5.6 % (ref 4.0–5.6)

## 2023-05-07 NOTE — Assessment & Plan Note (Signed)
The 10-year ASCVD risk score (Arnett DK, et al., 2019) is: 19.9%   Values used to calculate the score:     Age: 72 years     Sex: Male     Is Non-Hispanic African American: No     Diabetic: No     Tobacco smoker: No     Systolic Blood Pressure: 116 mmHg     Is BP treated: Yes     HDL Cholesterol: 45 mg/dL     Total Cholesterol: 229 mg/dL  Cardiovascular risk score was quite high.  I had recommended starting a statin previously.  Now that he has been exercising and getting more healthy and has lost weight he would like to recheck those labs and recalculate his cardiovascular risk.  He really does not want to take any additional medication.

## 2023-05-07 NOTE — Assessment & Plan Note (Signed)
Using testosterone supplementation.  Due for PSA and CBC check.

## 2023-05-07 NOTE — Assessment & Plan Note (Signed)
   Lab Results  Component Value Date   HGBA1C 5.6 05/07/2023   A1c looks great today at 5.6 which is down from previous of 5.8.

## 2023-05-07 NOTE — Progress Notes (Signed)
Established Patient Office Visit  Subjective   Patient ID: Glenn Meyer, male    DOB: Mar 07, 1951  Age: 72 y.o. MRN: [id number]  Chief Complaint  Patient presents with   Hypertension   ifg    HPI  Hypertension- Pt denies chest pain, SOB, dizziness, or heart palpitations.  Taking meds as directed w/o problems.  Denies medication side effects.    Impaired fasting glucose-no increased thirst or urination. No symptoms consistent with hypoglycemia.  Since last saw him he started eating more healthy and exercising regularly usually does 45 minutes of exercise 3 days/week he has been doing a combination of cardio and weight training.  He is down about 16 pounds since I last saw him.    ROS    Objective:     BP 116/66   Pulse (!) 52   Ht 6\' 4"  (1.93 m)   Wt 212 lb (96.2 kg)   SpO2 94%   BMI 25.81 kg/m    Physical Exam Constitutional:      Appearance: He is well-developed.  HENT:     Head: Normocephalic and atraumatic.  Cardiovascular:     Rate and Rhythm: Normal rate and regular rhythm.     Heart sounds: Normal heart sounds.  Pulmonary:     Effort: Pulmonary effort is normal.     Breath sounds: Normal breath sounds.  Skin:    General: Skin is warm and dry.  Neurological:     Mental Status: He is alert and oriented to person, place, and time.  Psychiatric:        Behavior: Behavior normal.      Results for orders placed or performed in visit on 05/07/23  POCT glycosylated hemoglobin (Hb A1C)  Result Value Ref Range   Hemoglobin A1C 5.6 4.0 - 5.6 %   HbA1c POC (<> result, manual entry)     HbA1c, POC (prediabetic range)     HbA1c, POC (controlled diabetic range)        The 10-year ASCVD risk score (Arnett DK, et al., 2019) is: 19.9%    Assessment & Plan:   Problem List Items Addressed This Visit       Cardiovascular and Mediastinum   Hypertension    Pressure looks fantastic.  Continue current regimen.  Due for updated labs.        Endocrine    IFG (impaired fasting glucose) - Primary      Lab Results  Component Value Date   HGBA1C 5.6 05/07/2023   A1c looks great today at 5.6 which is down from previous of 5.8.      Relevant Orders   POCT glycosylated hemoglobin (Hb A1C) (Completed)   Lipid Panel w/reflex Direct LDL   COMPLETE METABOLIC PANEL WITH GFR   CBC   PSA     Other   Low testosterone    Using testosterone supplementation.  Due for PSA and CBC check.      Relevant Orders   Lipid Panel w/reflex Direct LDL   COMPLETE METABOLIC PANEL WITH GFR   CBC   PSA   Hyperlipidemia    The 10-year ASCVD risk score (Arnett DK, et al., 2019) is: 19.9%   Values used to calculate the score:     Age: 17 years     Sex: Male     Is Non-Hispanic African American: No     Diabetic: No     Tobacco smoker: No     Systolic Blood Pressure: 116 mmHg  Is BP treated: Yes     HDL Cholesterol: 45 mg/dL     Total Cholesterol: 229 mg/dL  Cardiovascular risk score was quite high.  I had recommended starting a statin previously.  Now that he has been exercising and getting more healthy and has lost weight he would like to recheck those labs and recalculate his cardiovascular risk.  He really does not want to take any additional medication.       Other Visit Diagnoses     HYPERTENSION, BENIGN ESSENTIAL       Relevant Orders   Lipid Panel w/reflex Direct LDL   COMPLETE METABOLIC PANEL WITH GFR   CBC   PSA   Medication management       Encounter for monitoring testosterone replacement therapy       Relevant Orders   Lipid Panel w/reflex Direct LDL   COMPLETE METABOLIC PANEL WITH GFR   CBC   PSA       Return in about 6 months (around 11/07/2023) for Hypertension.    Nani Gasser, MD

## 2023-05-07 NOTE — Assessment & Plan Note (Signed)
Pressure looks fantastic.  Continue current regimen.  Due for updated labs.

## 2023-05-08 DIAGNOSIS — R7301 Impaired fasting glucose: Secondary | ICD-10-CM | POA: Diagnosis not present

## 2023-05-08 DIAGNOSIS — I10 Essential (primary) hypertension: Secondary | ICD-10-CM | POA: Diagnosis not present

## 2023-05-08 DIAGNOSIS — Z5181 Encounter for therapeutic drug level monitoring: Secondary | ICD-10-CM | POA: Diagnosis not present

## 2023-05-08 DIAGNOSIS — Z79890 Hormone replacement therapy (postmenopausal): Secondary | ICD-10-CM | POA: Diagnosis not present

## 2023-05-08 DIAGNOSIS — R7989 Other specified abnormal findings of blood chemistry: Secondary | ICD-10-CM | POA: Diagnosis not present

## 2023-05-08 DIAGNOSIS — Z125 Encounter for screening for malignant neoplasm of prostate: Secondary | ICD-10-CM | POA: Diagnosis not present

## 2023-05-09 ENCOUNTER — Encounter: Payer: Self-pay | Admitting: Family Medicine

## 2023-05-09 DIAGNOSIS — E785 Hyperlipidemia, unspecified: Secondary | ICD-10-CM

## 2023-05-09 LAB — COMPLETE METABOLIC PANEL WITH GFR
AG Ratio: 1.7 (calc) (ref 1.0–2.5)
ALT: 17 U/L (ref 9–46)
AST: 21 U/L (ref 10–35)
Albumin: 4.5 g/dL (ref 3.6–5.1)
Alkaline phosphatase (APISO): 55 U/L (ref 35–144)
BUN: 17 mg/dL (ref 7–25)
CO2: 29 mmol/L (ref 20–32)
Calcium: 9.7 mg/dL (ref 8.6–10.3)
Chloride: 102 mmol/L (ref 98–110)
Creat: 1 mg/dL (ref 0.70–1.28)
Globulin: 2.7 g/dL (calc) (ref 1.9–3.7)
Glucose, Bld: 111 mg/dL — ABNORMAL HIGH (ref 65–99)
Potassium: 5.1 mmol/L (ref 3.5–5.3)
Sodium: 137 mmol/L (ref 135–146)
Total Bilirubin: 0.5 mg/dL (ref 0.2–1.2)
Total Protein: 7.2 g/dL (ref 6.1–8.1)
eGFR: 81 mL/min/{1.73_m2} (ref >=60)

## 2023-05-09 LAB — LIPID PANEL W/REFLEX DIRECT LDL
Cholesterol: 229 mg/dL — ABNORMAL HIGH (ref <200)
HDL: 52 mg/dL (ref >=40)
LDL Cholesterol (Calc): 155 mg/dL (calc) — ABNORMAL HIGH
Non-HDL Cholesterol (Calc): 177 mg/dL (calc) — ABNORMAL HIGH (ref <130)
Total CHOL/HDL Ratio: 4.4 (calc) (ref <5.0)
Triglycerides: 105 mg/dL (ref <150)

## 2023-05-09 LAB — PSA: PSA: 2.67 ng/mL (ref <=4.00)

## 2023-05-09 LAB — CBC
HCT: 44.4 % (ref 38.5–50.0)
Hemoglobin: 14.9 g/dL (ref 13.2–17.1)
MCH: 29 pg (ref 27.0–33.0)
MCHC: 33.6 g/dL (ref 32.0–36.0)
MCV: 86.4 fL (ref 80.0–100.0)
MPV: 12.9 fL — ABNORMAL HIGH (ref 7.5–12.5)
Platelets: 161 10*3/uL (ref 140–400)
RBC: 5.14 10*6/uL (ref 4.20–5.80)
RDW: 13.9 % (ref 11.0–15.0)
WBC: 4.5 10*3/uL (ref 3.8–10.8)

## 2023-05-09 MED ORDER — ROSUVASTATIN CALCIUM 10 MG PO TABS: 10.0000 mg | ORAL_TABLET | Freq: Every day | ORAL | 3 refills | Status: DC

## 2023-05-09 NOTE — Progress Notes (Signed)
Jillyn Hidden, LDL cholesterol is still elevated at 155.  Your cardiovascular risk score is still around 18%.  Anything 10% or higher we highly recommend a statin to reduce your risk for heart attack and stroke.  I know we had discussed it briefly.  If you are okay with moving forward then please let me know and I can send a new prescription to your pharmacy.  We would then need to recheck your labs in 3 months.  Glucose was still just mildly elevated but kidney and liver look stable.  Blood count is normal.  Prostate test is normal as well.  The 10-year ASCVD risk score (Arnett DK, et al., 2019) is: 18.7%   Values used to calculate the score:     Age: 6 years     Sex: Male     Is Non-Hispanic African American: No     Diabetic: No     Tobacco smoker: No     Systolic Blood Pressure: 116 mmHg     Is BP treated: Yes     HDL Cholesterol: 52 mg/dL     Total Cholesterol: 229 mg/dL

## 2023-05-27 DIAGNOSIS — L820 Inflamed seborrheic keratosis: Secondary | ICD-10-CM | POA: Diagnosis not present

## 2023-06-24 ENCOUNTER — Other Ambulatory Visit: Payer: Self-pay | Admitting: Family Medicine

## 2023-06-24 DIAGNOSIS — I10 Essential (primary) hypertension: Secondary | ICD-10-CM

## 2023-06-25 MED ORDER — AMLODIPINE BESYLATE 10 MG PO TABS
10.0000 mg | ORAL_TABLET | Freq: Every day | ORAL | 1 refills | Status: DC
Start: 2023-06-25 — End: 2023-12-23

## 2023-07-12 ENCOUNTER — Ambulatory Visit
Admission: EM | Admit: 2023-07-12 | Discharge: 2023-07-12 | Disposition: A | Discharge status: Home / Self Care | Payer: Medicare HMO | Attending: Physician Assistant | Admitting: Physician Assistant

## 2023-07-12 DIAGNOSIS — H6692 Otitis media, unspecified, left ear: Secondary | ICD-10-CM

## 2023-07-12 MED ORDER — AMOXICILLIN 500 MG PO CAPS: 500.0000 mg | ORAL_CAPSULE | Freq: Three times a day (TID) | ORAL | 0 refills | Status: DC

## 2023-07-12 NOTE — ED Provider Notes (Signed)
[facility]    CSN: [id number] Arrival date & time: 07/12/23  1412      History   Chief Complaint Chief Complaint  Patient presents with   Nasal Congestion   Otalgia    LT    HPI Glenn Meyer is a 72 y.o. male.   Patient complains of nasal drainage.  Patient reports he has phlegm in his throat causing him to cough.  Patient reports he just flew on a plane and now cannot hear out of his left ear patient reports discomfort in his left ear.  Patient reports he has chronic tinnitus.  Patient has an appointment to see the audiologist next week.  The history is provided by the patient. No language interpreter was used.  Otalgia   Past Medical History:  Diagnosis Date   Arthritis 2013   Had left hip replacement surgery on 10/25/21   GERD (gastroesophageal reflux disease) 1985   No change   Hyperlipidemia    Hypertension    Ringing of ears    chronic X 20 years   Tinnitus    TMJ (dislocation of temporomandibular joint)     Patient Active Problem List   Diagnosis Date Noted   Acute left-sided low back pain without sciatica 04/12/2021   Low testosterone 12/29/2020   TMJ (dislocation of temporomandibular joint)    Tinnitus    Ringing of ears    Hypertension    BPH associated with nocturia 07/19/2020   Patellofemoral arthritis of right knee 10/02/2018   IFG (impaired fasting glucose) 07/30/2018   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 Authorizing Provider  amoxicillin (AMOXIL) 500 MG  capsule Take 1 capsule (500 mg total) by mouth 3 (three) times daily. 07/12/23  Yes Cheron Schaumann K, PA-C  amLODipine (NORVASC) 10 MG tablet Take 1 tablet (10 mg total) by mouth daily. 06/25/23   Agapito Games, MD  esomeprazole (NEXIUM) 40 MG capsule Take 40 mg by mouth daily at 12 noon.    [provider]  glucosamine-chondroitin 500-400 MG tablet Take 1 tablet by mouth 3 (three) times daily.    [provider]  Multiple Vitamin (MULTIVITAMIN) capsule Take 1 capsule by mouth daily.    [provider]  rosuvastatin (CRESTOR) 10 MG tablet Take 1 tablet (10 mg total) by mouth daily. 05/09/23   Agapito Games, MD  tadalafil (CIALIS) 5 MG tablet Take 1 tablet (5 mg total) by mouth daily. 09/24/22   Agapito Games, MD  Testosterone 1.62 % GEL Apply 1 Pump topically every morning. 04/29/23   Agapito Games, MD    Family History Family History  Problem Relation Age of Onset   Hypertension Mother    Hyperlipidemia Mother    Kidney disease Mother        congenital/diaylisis 57's   Heart disease Father        bypass 2000   Hypertension Other    Hyperlipidemia Other  Social History Social History   Tobacco Use   Smoking status: Former    Current packs/day: 0.00    Types: Cigarettes    Start date: 10/22/1969    Quit date: 10/22/1972    Years since quitting: 50.7   Smokeless tobacco: Never   Tobacco comments:    quit 34 years ago  Vaping Use   Vaping status: Never Used  Substance Use Topics   Alcohol use: Yes    Alcohol/week: 3.0 standard drinks of alcohol    Types: 3 Cans of beer per week    Comment: per week   Drug use: No     Allergies   Lisinopril-hydrochlorothiazide and Meperidine hcl   Review of Systems Review of Systems  HENT:  Positive for ear pain.   All other systems reviewed and are negative.    Physical Exam Triage Vital Signs ED Triage Vitals  Encounter Vitals Group     BP --      Systolic BP Percentile --       Diastolic BP Percentile --      Pulse Rate 07/12/23 1422 60     Resp 07/12/23 1422 (!) 2     Temp 07/12/23 1422 98.2 F (36.8 C)     Temp Source 07/12/23 1422 Oral     SpO2 07/12/23 1422 97 %     Weight --      Height --      Head Circumference --      Peak Flow --      Pain Score 07/12/23 1423 0     Pain Loc --      Pain Education --      Exclude from Growth Chart --    No data found.  Updated Vital Signs Pulse 60   Temp 98.2 F (36.8 C) (Oral)   Resp (!) 2   SpO2 97%   Visual Acuity Right Eye Distance:   Left Eye Distance:   Bilateral Distance:    Right Eye Near:   Left Eye Near:    Bilateral Near:     Physical Exam Vitals and nursing note reviewed.  Constitutional:      Appearance: He is well-developed.  HENT:     Head: Normocephalic.     Ears:     Comments: Left TM slight erythema bulging    Mouth/Throat:     Mouth: Mucous membranes are moist.  Cardiovascular:     Rate and Rhythm: Normal rate and regular rhythm.  Pulmonary:     Effort: Pulmonary effort is normal.  Abdominal:     General: There is no distension.  Musculoskeletal:        General: Normal range of motion.     Cervical back: Normal range of motion.  Neurological:     Mental Status: He is alert and oriented to person, place, and time.      UC Treatments / Results  Labs (all labs ordered are listed, but only abnormal results are displayed) Labs Reviewed - No data to display  EKG   Radiology No results found.  Procedures Procedures (including critical care time)  Medications Ordered in UC Medications - No data to display  Initial Impression / Assessment and Plan / UC Course  I have reviewed the triage vital signs and the nursing notes.  Pertinent labs & imaging results that were available during my care of the patient were reviewed by me and considered in my medical decision making (see chart for details).  Final Clinical Impressions(s) / UC Diagnoses   Final  diagnoses:  Left otitis media, unspecified otitis media type     Discharge Instructions      Return if any problems.     ED Prescriptions     Medication Sig Dispense Auth. Provider   amoxicillin (AMOXIL) 500 MG capsule Take 1 capsule (500 mg total) by mouth 3 (three) times daily. 30 capsule Elson Areas, [place]      PDMP not reviewed this encounter. An After Visit Summary was printed and given to the patient.       Elson Areas, [place] 07/12/23 1441

## 2023-07-12 NOTE — Discharge Instructions (Addendum)
Return if any problems.

## 2023-07-12 NOTE — ED Triage Notes (Signed)
Pt c/o sinus congestion and LT ear fullness sine Wed. Denies fever. Hx of sinus infections. Taking nyquil prn.

## 2023-07-13 ENCOUNTER — Other Ambulatory Visit: Payer: Self-pay | Admitting: Family Medicine

## 2023-07-13 DIAGNOSIS — R7989 Other specified abnormal findings of blood chemistry: Secondary | ICD-10-CM

## 2023-07-16 ENCOUNTER — Other Ambulatory Visit: Payer: Self-pay | Admitting: Family Medicine

## 2023-07-16 DIAGNOSIS — R7989 Other specified abnormal findings of blood chemistry: Secondary | ICD-10-CM

## 2023-07-17 ENCOUNTER — Other Ambulatory Visit: Payer: Self-pay

## 2023-07-17 ENCOUNTER — Encounter: Payer: Self-pay | Admitting: Family Medicine

## 2023-07-17 DIAGNOSIS — R7989 Other specified abnormal findings of blood chemistry: Secondary | ICD-10-CM

## 2023-07-17 NOTE — Telephone Encounter (Signed)
Confirmed with pharmacy that they had received the mentioned Rx.  They had and stated that it is being processed and I also informed the patient of this.

## 2023-08-13 ENCOUNTER — Other Ambulatory Visit: Payer: Self-pay | Admitting: Family Medicine

## 2023-08-13 DIAGNOSIS — N401 Enlarged prostate with lower urinary tract symptoms: Secondary | ICD-10-CM

## 2023-08-14 ENCOUNTER — Ambulatory Visit (INDEPENDENT_AMBULATORY_CARE_PROVIDER_SITE_OTHER): Payer: Medicare HMO | Admitting: Family Medicine

## 2023-08-14 ENCOUNTER — Encounter: Payer: Self-pay | Admitting: Family Medicine

## 2023-08-14 ENCOUNTER — Other Ambulatory Visit: Payer: Self-pay | Admitting: Family Medicine

## 2023-08-14 DIAGNOSIS — Z23 Encounter for immunization: Secondary | ICD-10-CM

## 2023-08-14 DIAGNOSIS — N401 Enlarged prostate with lower urinary tract symptoms: Secondary | ICD-10-CM

## 2023-08-14 NOTE — Telephone Encounter (Signed)
Completed 10/22/204

## 2023-08-19 DIAGNOSIS — H2513 Age-related nuclear cataract, bilateral: Secondary | ICD-10-CM | POA: Diagnosis not present

## 2023-08-19 DIAGNOSIS — H524 Presbyopia: Secondary | ICD-10-CM | POA: Diagnosis not present

## 2023-09-17 ENCOUNTER — Encounter: Payer: Self-pay | Admitting: Family Medicine

## 2023-09-17 DIAGNOSIS — N529 Male erectile dysfunction, unspecified: Secondary | ICD-10-CM

## 2023-09-17 MED ORDER — SILDENAFIL CITRATE 50 MG PO TABS: 50.0000 mg | ORAL_TABLET | Freq: Every day | ORAL | 99 refills | Status: DC | PRN

## 2023-10-03 ENCOUNTER — Encounter: Payer: Self-pay | Admitting: Family Medicine

## 2023-10-03 DIAGNOSIS — E291 Testicular hypofunction: Secondary | ICD-10-CM

## 2023-10-04 ENCOUNTER — Other Ambulatory Visit: Payer: Self-pay | Admitting: *Deleted

## 2023-10-04 NOTE — Addendum Note (Signed)
Addended by: Deno Etienne on: 10/04/2023 03:42 PM   Modules accepted: Orders

## 2023-10-04 NOTE — Addendum Note (Signed)
Addended by: Nani Gasser D on: 10/04/2023 03:13 PM   Modules accepted: Orders

## 2023-10-08 DIAGNOSIS — E291 Testicular hypofunction: Secondary | ICD-10-CM | POA: Diagnosis not present

## 2023-10-09 LAB — CBC WITH DIFFERENTIAL/PLATELET
Basophils Absolute: 0 10*3/uL (ref 0.0–0.2)
Basos: 1 %
EOS (ABSOLUTE): 0.1 10*3/uL (ref 0.0–0.4)
Eos: 1 %
Hematocrit: 43.9 % (ref 37.5–51.0)
Hemoglobin: 14.4 g/dL (ref 13.0–17.7)
Immature Grans (Abs): 0 10*3/uL (ref 0.0–0.1)
Immature Granulocytes: 0 %
Lymphocytes Absolute: 1.4 10*3/uL (ref 0.7–3.1)
Lymphs: 33 %
MCH: 28.7 pg (ref 26.6–33.0)
MCHC: 32.8 g/dL (ref 31.5–35.7)
MCV: 88 fL (ref 79–97)
Monocytes Absolute: 0.5 10*3/uL (ref 0.1–0.9)
Monocytes: 11 %
Neutrophils Absolute: 2.3 10*3/uL (ref 1.4–7.0)
Neutrophils: 54 %
Platelets: 173 10*3/uL (ref 150–450)
RBC: 5.01 x10E6/uL (ref 4.14–5.80)
RDW: 13.3 % (ref 11.6–15.4)
WBC: 4.2 10*3/uL (ref 3.4–10.8)

## 2023-10-10 LAB — TESTOSTERONE, FREE, TOTAL, SHBG
Sex Hormone Binding: 26 nmol/L (ref 19.3–76.4)
Testosterone, Free: 4.7 pg/mL — ABNORMAL LOW (ref 6.6–18.1)
Testosterone: 247 ng/dL — ABNORMAL LOW (ref 264–916)

## 2023-10-10 NOTE — Progress Notes (Signed)
Hi Glenn Meyer, testosterone levels are little on the low side we can definitely make an adjustment.  I think you are currently doing 1 pump each day so please increase to 2 so you would put 1 pump on the right arm and 1 pump on the left arm and rub it in.  Will plan to recheck your levels in about 6 to 8 weeks to make sure that it is adequate.  Blood count is normal.

## 2023-10-26 ENCOUNTER — Encounter: Payer: Self-pay | Admitting: Family Medicine

## 2023-11-04 ENCOUNTER — Encounter: Payer: Self-pay | Admitting: Family Medicine

## 2023-11-04 DIAGNOSIS — N401 Enlarged prostate with lower urinary tract symptoms: Secondary | ICD-10-CM

## 2023-11-04 MED ORDER — TADALAFIL 5 MG PO TABS: 5.0000 mg | ORAL_TABLET | Freq: Every day | ORAL | 3 refills | Status: DC

## 2023-11-07 ENCOUNTER — Ambulatory Visit: Payer: Medicare HMO | Admitting: Family Medicine

## 2023-11-20 DIAGNOSIS — H524 Presbyopia: Secondary | ICD-10-CM | POA: Diagnosis not present

## 2023-12-04 ENCOUNTER — Encounter: Payer: Self-pay | Admitting: Family Medicine

## 2023-12-04 DIAGNOSIS — N401 Enlarged prostate with lower urinary tract symptoms: Secondary | ICD-10-CM

## 2023-12-04 DIAGNOSIS — R7989 Other specified abnormal findings of blood chemistry: Secondary | ICD-10-CM

## 2023-12-04 MED ORDER — TESTOSTERONE 1.62 % TD GEL: TRANSDERMAL | 1 refills | Status: DC

## 2023-12-04 NOTE — Telephone Encounter (Signed)
Patient requesting tetsoterone gel refill Last written 07/16/2023 Last OV 071/03/2023 Upcoming appt 12/11/2023  Patient requesting rx rf also of sildenafil  but showing in patient chart as tadalfil.

## 2023-12-04 NOTE — Telephone Encounter (Signed)
Refilled testosterone but let him know we just refilled the Cialis in January for a year.

## 2023-12-06 ENCOUNTER — Encounter: Payer: Self-pay | Admitting: Sports Medicine

## 2023-12-06 NOTE — Telephone Encounter (Signed)
Okay for letter for patient stating that he does not need prophylactic antibiotics status post joint replacement especially since he is 1 year out from surgery.

## 2023-12-09 ENCOUNTER — Encounter: Payer: Self-pay | Admitting: Family Medicine

## 2023-12-11 ENCOUNTER — Ambulatory Visit (INDEPENDENT_AMBULATORY_CARE_PROVIDER_SITE_OTHER): Payer: Medicare HMO | Admitting: Family Medicine

## 2023-12-11 VITALS — BP 138/64 | HR 59 | Ht 76.0 in | Wt 219.2 lb

## 2023-12-11 DIAGNOSIS — N401 Enlarged prostate with lower urinary tract symptoms: Secondary | ICD-10-CM | POA: Diagnosis not present

## 2023-12-11 DIAGNOSIS — R7989 Other specified abnormal findings of blood chemistry: Secondary | ICD-10-CM

## 2023-12-11 DIAGNOSIS — R7301 Impaired fasting glucose: Secondary | ICD-10-CM

## 2023-12-11 DIAGNOSIS — I10 Essential (primary) hypertension: Secondary | ICD-10-CM

## 2023-12-11 DIAGNOSIS — E785 Hyperlipidemia, unspecified: Secondary | ICD-10-CM | POA: Diagnosis not present

## 2023-12-11 DIAGNOSIS — N529 Male erectile dysfunction, unspecified: Secondary | ICD-10-CM | POA: Insufficient documentation

## 2023-12-11 DIAGNOSIS — R351 Nocturia: Secondary | ICD-10-CM

## 2023-12-11 LAB — POCT GLYCOSYLATED HEMOGLOBIN (HGB A1C): HbA1c, POC (prediabetic range): 5.8 % (ref 5.7–6.4)

## 2023-12-11 MED ORDER — SILDENAFIL CITRATE 50 MG PO TABS
50.0000 mg | ORAL_TABLET | Freq: Every day | ORAL | 3 refills | Status: DC | PRN
Start: 2023-12-11 — End: 2024-06-11

## 2023-12-11 MED ORDER — TESTOSTERONE 1.62 % TD GEL
TRANSDERMAL | 5 refills | Status: DC
Start: 2023-12-11 — End: 2024-03-04

## 2023-12-11 NOTE — Assessment & Plan Note (Signed)
 Pressure little borderline elevated today we will recheck before he leaves today.

## 2023-12-11 NOTE — Assessment & Plan Note (Signed)
 A1c 5.8 today still in the prediabetes range but stable.  Continue to work on healthy diet more recently he has been back in the gym he did not go for several months because his wife actually fractured her leg and he had been taking care of her.

## 2023-12-11 NOTE — Assessment & Plan Note (Addendum)
 Bump to 2 pumps dialy based on last labs. Recheck test level in 6-8 weeks.

## 2023-12-11 NOTE — Progress Notes (Signed)
 Established Patient Office Visit  Subjective  Patient ID: Glenn Meyer, male    DOB: Aug 18, 1951  Age: 73 y.o. MRN: [id number]  Chief Complaint  Patient presents with   Hypertension    6 mth HTN f/u    medication questions    Patient  wanting to discuss  changing testosterone medication from gel. States only works for [employer] 6 to 7 hours. - also having some medications regarding ED medication dosing.     HPI    ROS    Objective:     BP 138/64   Pulse (!) 59   Ht 6\' 4"  (1.93 m)   Wt 219 lb 4 oz (99.5 kg)   SpO2 100%   BMI 26.69 kg/m    Physical Exam Vitals and nursing note reviewed.  Constitutional:      Appearance: Normal appearance.  HENT:     Head: Normocephalic and atraumatic.  Eyes:     Conjunctiva/sclera: Conjunctivae normal.  Cardiovascular:     Rate and Rhythm: Normal rate and regular rhythm.  Pulmonary:     Effort: Pulmonary effort is normal.     Breath sounds: Normal breath sounds.  Skin:    General: Skin is warm and dry.  Neurological:     Mental Status: He is alert.  Psychiatric:        Mood and Affect: Mood normal.      Results for orders placed or performed in visit on 12/11/23  POCT HgB A1C  Result Value Ref Range   Hemoglobin A1C     HbA1c POC (<> result, manual entry)     HbA1c, POC (prediabetic range) 5.8 5.7 - 6.4 %   HbA1c, POC (controlled diabetic range)        The 10-year ASCVD risk score (Arnett DK, et al., 2019) is: 26.1%    Assessment & Plan:   Problem List Items Addressed This Visit       Cardiovascular and Mediastinum   Hypertension - Primary   Pressure little borderline elevated today we will recheck before he leaves today.      Relevant Medications   ASPIRIN 81 PO   sildenafil (VIAGRA) 50 MG tablet     Endocrine   IFG (impaired fasting glucose)   A1c 5.8 today still in the prediabetes range but stable.  Continue to work on healthy diet more recently he has been back in the gym he did not go for several  months because his wife actually fractured her leg and he had been taking care of her.      Relevant Orders   POCT HgB A1C (Completed)     Other   Low testosterone   Bump to 2 pumps dialy based on last labs. Recheck test level in 6-8 weeks.        Relevant Medications   Testosterone 1.62 % GEL   Other Relevant Orders   Testosterone Total,Free,Bio, Males   Hyperlipidemia   Relevant Medications   ASPIRIN 81 PO   sildenafil (VIAGRA) 50 MG tablet   Other Relevant Orders   CMP14+EGFR   Lipid panel   Erectile dysfunction   We did discuss okay to use 50 mg Viagra as needed on top of his daily 5 mg Cialis since that does not seem to be effective in treating his ED.      Relevant Medications   sildenafil (VIAGRA) 50 MG tablet   BPH associated with nocturia   Dong well weil low dose cialis 5mg  daily.  Has noticed nocturia much improved.        Return in about 6 months (around 06/09/2024) for Hypertension, meds .    Nani Gasser, MD

## 2023-12-11 NOTE — Assessment & Plan Note (Signed)
 We did discuss okay to use 50 mg Viagra as needed on top of his daily 5 mg Cialis since that does not seem to be effective in treating his ED.

## 2023-12-11 NOTE — Assessment & Plan Note (Signed)
 Dong well weil low dose cialis 5mg  daily.  Has noticed nocturia much improved.

## 2023-12-23 ENCOUNTER — Other Ambulatory Visit: Payer: Self-pay | Admitting: Family Medicine

## 2023-12-23 ENCOUNTER — Other Ambulatory Visit: Payer: Self-pay

## 2023-12-23 DIAGNOSIS — I10 Essential (primary) hypertension: Secondary | ICD-10-CM

## 2023-12-23 MED ORDER — AMLODIPINE BESYLATE 10 MG PO TABS: 10.0000 mg | ORAL_TABLET | Freq: Every day | ORAL | 1 refills | Status: DC

## 2024-01-11 ENCOUNTER — Encounter: Payer: Self-pay | Admitting: Family Medicine

## 2024-01-20 ENCOUNTER — Telehealth: Payer: Self-pay

## 2024-01-20 ENCOUNTER — Other Ambulatory Visit (HOSPITAL_COMMUNITY): Payer: Self-pay

## 2024-01-20 NOTE — Telephone Encounter (Signed)
 Pharmacy Patient Advocate Encounter   Received notification from Onbase that prior authorization for Testosterone 1.62 is required/requested.   Insurance verification completed.   The patient is insured through [place] .   Per test claim: PA required; PA submitted to above mentioned insurance via CoverMyMeds Key/confirmation #/EOC [id number] Status is pending

## 2024-01-20 NOTE — Telephone Encounter (Signed)
 Pharmacy Patient Advocate Encounter  Received notification from [facility] that Prior Authorization for Testosterone 1.62 gel has been APPROVED from 01/20/24 to 10/21/24. Ran test claim, Copay is $60.15. This test claim was processed through [facility]- copay amounts may vary at other pharmacies due to pharmacy/plan contracts, or as the patient moves through the different stages of their insurance plan.   PA #/Case ID/Reference #: [id number]

## 2024-01-28 ENCOUNTER — Other Ambulatory Visit: Payer: Self-pay | Admitting: *Deleted

## 2024-01-28 DIAGNOSIS — E785 Hyperlipidemia, unspecified: Secondary | ICD-10-CM | POA: Diagnosis not present

## 2024-01-28 DIAGNOSIS — R7989 Other specified abnormal findings of blood chemistry: Secondary | ICD-10-CM

## 2024-01-28 DIAGNOSIS — E291 Testicular hypofunction: Secondary | ICD-10-CM | POA: Diagnosis not present

## 2024-01-28 DIAGNOSIS — I10 Essential (primary) hypertension: Secondary | ICD-10-CM | POA: Diagnosis not present

## 2024-01-30 ENCOUNTER — Encounter: Payer: Self-pay | Admitting: Family Medicine

## 2024-01-30 DIAGNOSIS — E785 Hyperlipidemia, unspecified: Secondary | ICD-10-CM

## 2024-01-31 ENCOUNTER — Encounter: Payer: Self-pay | Admitting: Family Medicine

## 2024-01-31 LAB — TESTOSTERONE, FREE, TOTAL, SHBG
Sex Hormone Binding: 22.1 nmol/L (ref 19.3–76.4)
Testosterone, Free: 4.1 pg/mL — ABNORMAL LOW (ref 6.6–18.1)
Testosterone: 192 ng/dL — ABNORMAL LOW (ref 264–916)

## 2024-01-31 LAB — LIPID PANEL WITH LDL/HDL RATIO
Cholesterol, Total: 151 mg/dL (ref 100–199)
HDL: 43 mg/dL (ref >39)
LDL Chol Calc (NIH): 88 mg/dL (ref 0–99)
LDL/HDL Ratio: 2 ratio (ref 0.0–3.6)
Triglycerides: 111 mg/dL (ref 0–149)
VLDL Cholesterol Cal: 20 mg/dL (ref 5–40)

## 2024-01-31 LAB — CMP14+EGFR
ALT: 19 IU/L (ref 0–44)
AST: 24 IU/L (ref 0–40)
Albumin: 4.5 g/dL (ref 3.8–4.8)
Alkaline Phosphatase: 65 IU/L (ref 44–121)
BUN/Creatinine Ratio: 16 (ref 10–24)
BUN: 15 mg/dL (ref 8–27)
Bilirubin Total: 0.2 mg/dL (ref 0.0–1.2)
CO2: 23 mmol/L (ref 20–29)
Calcium: 9.3 mg/dL (ref 8.6–10.2)
Chloride: 103 mmol/L (ref 96–106)
Creatinine, Ser: 0.92 mg/dL (ref 0.76–1.27)
Globulin, Total: 2.3 g/dL (ref 1.5–4.5)
Glucose: 98 mg/dL (ref 70–99)
Potassium: 4.8 mmol/L (ref 3.5–5.2)
Sodium: 141 mmol/L (ref 134–144)
Total Protein: 6.8 g/dL (ref 6.0–8.5)
eGFR: 89 mL/min/{1.73_m2} (ref >59)

## 2024-01-31 MED ORDER — ROSUVASTATIN CALCIUM 20 MG PO TABS: 20.0000 mg | ORAL_TABLET | Freq: Every day | ORAL | 3 refills | Status: AC

## 2024-01-31 NOTE — Telephone Encounter (Signed)
 Meds ordered this encounter  Medications   rosuvastatin (CRESTOR) 20 MG tablet    Sig: Take 1 tablet (20 mg total) by mouth daily.    Dispense:  90 tablet    Refill:  3

## 2024-01-31 NOTE — Progress Notes (Signed)
 Hi Glenn Meyer, metabolic panel looks great.  Cholesterol is not optimal.  Would really like to see that LDL under 70.  It is close but not quite there.  I would like to make an adjustment on your Crestor if you are okay with that we could bump it up to 20 mg nightly.  The total testosterone and free testosterone are still low.  Have you started the testosterone gel?  It looks like it was approved about 2 weeks ago or is this off of medication?  And did you apply it that morning before getting your blood work drawn?  The 10-year ASCVD risk score (Arnett DK, et al., 2019) is: 23.5%   Values used to calculate the score:     Age: 73 years     Sex: Male     Is Non-Hispanic African American: No     Diabetic: No     Tobacco smoker: No     Systolic Blood Pressure: 138 mmHg     Is BP treated: Yes     HDL Cholesterol: 43 mg/dL     Total Cholesterol: 151 mg/dL

## 2024-01-31 NOTE — Telephone Encounter (Signed)
 To schedule for testosterone 200 mg every 2 weeks.  We can get him on the nurse schedule for that.

## 2024-02-04 ENCOUNTER — Ambulatory Visit (INDEPENDENT_AMBULATORY_CARE_PROVIDER_SITE_OTHER): Admitting: Family Medicine

## 2024-02-04 VITALS — BP 145/82 | HR 60 | Ht 76.0 in | Wt 225.0 lb

## 2024-02-04 DIAGNOSIS — E291 Testicular hypofunction: Secondary | ICD-10-CM | POA: Diagnosis not present

## 2024-02-04 DIAGNOSIS — R7989 Other specified abnormal findings of blood chemistry: Secondary | ICD-10-CM | POA: Diagnosis not present

## 2024-02-04 MED ORDER — TESTOSTERONE CYPIONATE 200 MG/ML IM SOLN
200.0000 mg | INTRAMUSCULAR | Status: DC
  Administered 2024-02-04 – 2024-05-26 (×5): 200 mg via INTRAMUSCULAR

## 2024-02-04 NOTE — Progress Notes (Signed)
 Pt here testosterone injection. Denies SOB, CP, mood changes. Pt also inquiring about the auto injector XYOSTED  en would like to see if it is covered by his insurance. Pt is also asking if it is possible to injections monthly. I told him it is probably due to it being very low. And that it is a possibly that Dr. Greer Leak will change in the future.                 Pt given 200mg  of testosterone in LUOQ. Tolerated well no redness or swelling noted. Pt advised to RTC in 2 wks around 02/18/24.

## 2024-02-07 ENCOUNTER — Encounter: Payer: Self-pay | Admitting: Family Medicine

## 2024-02-12 ENCOUNTER — Encounter: Payer: Self-pay | Admitting: Family Medicine

## 2024-02-12 DIAGNOSIS — Z1211 Encounter for screening for malignant neoplasm of colon: Secondary | ICD-10-CM

## 2024-02-12 NOTE — Telephone Encounter (Signed)
 Cologuard order sent

## 2024-02-18 ENCOUNTER — Encounter: Payer: Self-pay | Admitting: Family Medicine

## 2024-02-18 ENCOUNTER — Ambulatory Visit (INDEPENDENT_AMBULATORY_CARE_PROVIDER_SITE_OTHER): Admitting: Family Medicine

## 2024-02-18 VITALS — BP 130/69 | HR 60 | Temp 97.9°F | Ht 76.0 in | Wt 222.1 lb

## 2024-02-18 DIAGNOSIS — E291 Testicular hypofunction: Secondary | ICD-10-CM

## 2024-02-18 MED ORDER — TESTOSTERONE CYPIONATE 200 MG/ML IM SOLN
200.0000 mg | Freq: Once | INTRAMUSCULAR | Status: AC
  Administered 2024-02-18: 200 mg via INTRAMUSCULAR

## 2024-02-18 NOTE — Progress Notes (Signed)
 Agree with documentation as above.   Nani Gasser, MD

## 2024-02-18 NOTE — Progress Notes (Signed)
 Patient is here for a testosterone  injection. Denies chest pain, shortness of breath, headaches and problems with medication or mood changes.   Patient tolerated injection on TRUOQ well without complications. Patient advised to schedule next injection in 14 days.

## 2024-02-19 ENCOUNTER — Other Ambulatory Visit: Payer: Self-pay | Admitting: *Deleted

## 2024-02-19 DIAGNOSIS — E291 Testicular hypofunction: Secondary | ICD-10-CM

## 2024-02-19 MED ORDER — NEEDLE (DISP) 18G X 1-1/2" MISC: 99 refills | Status: DC

## 2024-02-19 MED ORDER — BD INTEGRA NEEDLE 23G X 1" MISC: 99 refills | Status: DC

## 2024-03-03 ENCOUNTER — Ambulatory Visit (INDEPENDENT_AMBULATORY_CARE_PROVIDER_SITE_OTHER): Admitting: Physician Assistant

## 2024-03-03 ENCOUNTER — Other Ambulatory Visit: Payer: Self-pay | Admitting: *Deleted

## 2024-03-03 DIAGNOSIS — E291 Testicular hypofunction: Secondary | ICD-10-CM

## 2024-03-03 MED ORDER — TESTOSTERONE CYPIONATE 200 MG/ML IM SOLN
200.0000 mg | Freq: Once | INTRAMUSCULAR | Status: AC
  Administered 2024-03-03: 200 mg via INTRAMUSCULAR

## 2024-03-03 NOTE — Telephone Encounter (Signed)
 Pt would like to start doing his testosterone  injections at home he has the needles but has not gotten the other needed supplies to do this.

## 2024-03-03 NOTE — Progress Notes (Signed)
 Pt here today to be taught how to do self administrations for testosterone  injection.   He denied any SOB, CP or Mood swings after previous injection.   I showed pt what to look for before drawing up testosterone , remove seal, wipe off the top of the bottle with alcohol prior to inserting needle in to draw medication,insert needle straight into vial and then invert. Pull syringe out towards the tip and then begin to pull the plunger of the syringe back to withdraw the medication. Once all of the medication has been withdrawn he was told to remove the needle and carefully place cap back on the needle and push up on the plunger to expel any air/air bubbles. Take off the drawing needle, check to be sure of dose, and place the injection needle onto the syringe. Choose injection site, clean area with alcohol allow area to dry before injection, remove needle cap being sure not to touch or allow it to touch anything, insert needle in a 90 degree angle push plunger all the way down, once all medication has been administered withdraw the needle and engage the safety.    Pt was advised that should he have any questions or concerns that he can come back in and we would be more than happy to go over any of this with him again. He voiced understanding and agreed.   Injection given in L vastus medialis.  Pt asked that we send Testosterone , syringes, and alcohol pads to pharmacy for him to start doing his injections at home. He stated that he has already gotten the needles.

## 2024-03-04 ENCOUNTER — Other Ambulatory Visit: Payer: Self-pay | Admitting: Family Medicine

## 2024-03-04 ENCOUNTER — Other Ambulatory Visit (HOSPITAL_COMMUNITY): Payer: Self-pay

## 2024-03-04 ENCOUNTER — Telehealth: Payer: Self-pay

## 2024-03-04 DIAGNOSIS — E291 Testicular hypofunction: Secondary | ICD-10-CM

## 2024-03-04 DIAGNOSIS — R7989 Other specified abnormal findings of blood chemistry: Secondary | ICD-10-CM

## 2024-03-04 MED ORDER — ALCOHOL PREP 70 % PADS: MEDICATED_PAD | 1 refills | Status: DC

## 2024-03-04 MED ORDER — TESTOSTERONE CYPIONATE 200 MG/ML IM SOLN: 200.0000 mg | INTRAMUSCULAR | 1 refills | Status: DC

## 2024-03-04 MED ORDER — SYRINGE LUER LOCK 3 ML MISC: 1 refills | Status: DC

## 2024-03-04 NOTE — Telephone Encounter (Signed)
 Pharmacy Patient Advocate Encounter   Received notification from CoverMyMeds that prior authorization for Testosterone  Cypionate 200MG /ML intramuscular solution is required/requested.   Insurance verification completed.   The patient is insured through [place] .   Per test claim: PA required; PA submitted to above mentioned insurance via CoverMyMeds Key/confirmation #/EOC [id number] Status is pending

## 2024-03-04 NOTE — Telephone Encounter (Signed)
 Current Meds  Medication Sig   testosterone  cypionate (DEPOTESTOSTERONE CYPIONATE) 200 MG/ML injection Inject 1 mL (200 mg total) into the muscle every 14 (fourteen) days.   Current Facility-Administered Medications for the 02/18/24 encounter (Patient Message) with Cydney Draft, MD  Medication   testosterone  cypionate (DEPOTESTOSTERONE CYPIONATE) injection 200 mg

## 2024-03-04 NOTE — Telephone Encounter (Signed)
 Pharmacy Patient Advocate Encounter  Received notification from [employer] that Prior Authorization for Testosterone  Cypionate 200MG /ML intramuscular solution  has been APPROVED from 10/23/23 to 10/21/24. Ran test claim, Copay is $73.20. This test claim was processed through [facility]- copay amounts may vary at other pharmacies due to pharmacy/plan contracts, or as the patient moves through the different stages of their insurance plan.   PA #/Case ID/Reference #: [id number]

## 2024-03-05 DIAGNOSIS — Z1211 Encounter for screening for malignant neoplasm of colon: Secondary | ICD-10-CM | POA: Diagnosis not present

## 2024-03-05 DIAGNOSIS — Z1212 Encounter for screening for malignant neoplasm of rectum: Secondary | ICD-10-CM | POA: Diagnosis not present

## 2024-03-06 NOTE — Telephone Encounter (Signed)
 Notified through MyChart.

## 2024-03-12 LAB — COLOGUARD: COLOGUARD: NEGATIVE

## 2024-03-13 ENCOUNTER — Ambulatory Visit: Payer: Self-pay | Admitting: Family Medicine

## 2024-03-13 NOTE — Progress Notes (Signed)
 Great news! Your Cologuard test is negative.  Recommend repeat colon cancer screening in 3 years.

## 2024-03-17 ENCOUNTER — Ambulatory Visit (INDEPENDENT_AMBULATORY_CARE_PROVIDER_SITE_OTHER)

## 2024-03-17 VITALS — BP 137/74 | HR 74 | Temp 98.4°F

## 2024-03-17 DIAGNOSIS — E291 Testicular hypofunction: Secondary | ICD-10-CM

## 2024-03-17 DIAGNOSIS — R7989 Other specified abnormal findings of blood chemistry: Secondary | ICD-10-CM | POA: Diagnosis not present

## 2024-03-17 MED ORDER — TESTOSTERONE CYPIONATE 200 MG/ML IM SOLN: 200.0000 mg | Freq: Once | INTRAMUSCULAR | Status: DC

## 2024-03-17 NOTE — Progress Notes (Cosign Needed Addendum)
 Pt is here for Testerone injection, denies chest pain shortness of breath, headaches and problems with medication or mood changes ,Injection was tolerated well by the patient. (See MAR for injection details)

## 2024-03-24 ENCOUNTER — Ambulatory Visit

## 2024-03-24 ENCOUNTER — Ambulatory Visit (INDEPENDENT_AMBULATORY_CARE_PROVIDER_SITE_OTHER): Admitting: Sports Medicine

## 2024-03-24 DIAGNOSIS — M545 Low back pain, unspecified: Secondary | ICD-10-CM

## 2024-03-24 DIAGNOSIS — M5136 Other intervertebral disc degeneration, lumbar region with discogenic back pain only: Secondary | ICD-10-CM | POA: Diagnosis not present

## 2024-03-24 DIAGNOSIS — M4807 Spinal stenosis, lumbosacral region: Secondary | ICD-10-CM | POA: Diagnosis not present

## 2024-03-24 DIAGNOSIS — M47814 Spondylosis without myelopathy or radiculopathy, thoracic region: Secondary | ICD-10-CM | POA: Diagnosis not present

## 2024-03-24 DIAGNOSIS — M47816 Spondylosis without myelopathy or radiculopathy, lumbar region: Secondary | ICD-10-CM | POA: Diagnosis not present

## 2024-03-24 MED ORDER — PREDNISONE 50 MG PO TABS: ORAL_TABLET | ORAL | 0 refills | Status: DC

## 2024-03-24 NOTE — Assessment & Plan Note (Signed)
 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 Will also consider neuropathic agents in the future. He is hoping to avoid a chronic medication regimen of any type.

## 2024-03-24 NOTE — Progress Notes (Signed)
    Procedures performed today:    None.  Independent interpretation of notes and tests performed by another provider:   None.  Brief History, Exam, Impression, and Recommendations:    Lumbar degenerative disc disease This is a very pleasant 73 year old male, long history of mid to upper lumbar back pain, worse with flexion, Valsalva, long drives all consistent with a discogenic pain generator, no radicular symptoms, no red flag symptoms, adding x-rays, 5 days of prednisone , we discussed the anatomy and pathophysiology of disc pain, he will do advanced herniated disc conditioning and return to see me in 6 weeks, MR for epidural planning if not better. Will also consider neuropathic agents in the future. He is hoping to avoid a chronic medication regimen of any type.  Chronic process with exacerbation and pharmacologic intervention  ____________________________________________ Joselyn Nicely. Sandy Crumb, M.D., ABFM., CAQSM., AME. Primary Care and Sports Medicine [facility]  Adjunct Professor of [facility] Medicine  [employer]  Restaurant manager, fast food

## 2024-03-31 ENCOUNTER — Ambulatory Visit (INDEPENDENT_AMBULATORY_CARE_PROVIDER_SITE_OTHER)

## 2024-03-31 VITALS — BP 139/61 | HR 65 | Ht 76.0 in

## 2024-03-31 DIAGNOSIS — R7989 Other specified abnormal findings of blood chemistry: Secondary | ICD-10-CM | POA: Diagnosis not present

## 2024-03-31 NOTE — Patient Instructions (Signed)
Return in 14 days for next testosterone injection as nurse visit.

## 2024-03-31 NOTE — Progress Notes (Signed)
   Established Patient Office Visit  Subjective   Patient ID: Glenn Meyer, male    DOB: 1950-12-06  Age: 73 y.o. MRN: [id number]  Chief Complaint  Patient presents with   Hypogonadism male    Testosterone  injection nurse visit    HPI  Hypogonadism in amle - testosterone  injection nurse visit. Patient denies chest pain, shortness of breath, dizziness, palpitation, moos changes or medication problems. Patient has decided to not do self injections of testosterone  but will schedule as nurse visit for injections to be given in office in future.   ROS    Objective:     BP 139/61 (BP Location: Left Arm, Patient Position: Sitting)   Pulse 65   Ht 6\' 4"  (1.93 m)   SpO2 97%   BMI 27.04 kg/m    Physical Exam   No results found for any visits on 03/31/24.    The 10-year ASCVD risk score (Arnett DK, et al., 2019) is: 23.8%    Assessment & Plan:  Testosterone  injection - admin 200mg  IM LUOQ ( patient states last injection was given in RUOQ) . Patient tolerated injection well without complications. Return in 14 days for next testosterone  injection as nurse visit.  Problem List Items Addressed This Visit       Other   Low testosterone  - Primary    Return in about 2 weeks (around 04/14/2024) for testosterone  injection as nurse visit. Dickie Found, LPN

## 2024-04-03 NOTE — Telephone Encounter (Signed)
We can discuss at next OV.

## 2024-04-13 ENCOUNTER — Encounter: Payer: Self-pay | Admitting: Family Medicine

## 2024-04-13 DIAGNOSIS — R7989 Other specified abnormal findings of blood chemistry: Secondary | ICD-10-CM

## 2024-04-14 ENCOUNTER — Ambulatory Visit (INDEPENDENT_AMBULATORY_CARE_PROVIDER_SITE_OTHER)

## 2024-04-14 VITALS — BP 129/70 | HR 57 | Temp 98.9°F | Ht 76.0 in | Wt 222.1 lb

## 2024-04-14 DIAGNOSIS — R7989 Other specified abnormal findings of blood chemistry: Secondary | ICD-10-CM

## 2024-04-14 MED ORDER — TESTOSTERONE CYPIONATE 200 MG/ML IM SOLN
200.0000 mg | Freq: Once | INTRAMUSCULAR | Status: AC
  Administered 2024-04-14: 200 mg via INTRAMUSCULAR

## 2024-04-14 NOTE — Progress Notes (Signed)
 Patient is here for a testosterone injection. Denies chest pain, shortness of breath, headaches and problems with medication or mood changes.   Patient tolerated injection on RUOQ well without complications. Patient advised to schedule next injection in 14 days.

## 2024-04-22 NOTE — Telephone Encounter (Signed)
 Orders Placed This Encounter  Procedures   Testosterone , Free, Total, SHBG

## 2024-04-22 NOTE — Telephone Encounter (Signed)
 Orders Placed This Encounter  Procedures   Testosterone , Free, Total, SHBG   PSA   CBC

## 2024-04-22 NOTE — Addendum Note (Signed)
 Addended by: Ellanor Feuerstein L on: 04/22/2024 09:58 AM   Modules accepted: Orders

## 2024-04-22 NOTE — Telephone Encounter (Signed)
 Lab order pended for provider to review and sign.

## 2024-04-22 NOTE — Addendum Note (Signed)
 Addended by: Abria Vannostrand D on: 04/22/2024 12:54 PM   Modules accepted: Orders

## 2024-04-23 DIAGNOSIS — R7989 Other specified abnormal findings of blood chemistry: Secondary | ICD-10-CM | POA: Diagnosis not present

## 2024-04-23 DIAGNOSIS — Z125 Encounter for screening for malignant neoplasm of prostate: Secondary | ICD-10-CM | POA: Diagnosis not present

## 2024-04-25 LAB — CBC
Hematocrit: 48.4 % (ref 37.5–51.0)
Hemoglobin: 15.1 g/dL (ref 13.0–17.7)
MCH: 28 pg (ref 26.6–33.0)
MCHC: 31.2 g/dL — ABNORMAL LOW (ref 31.5–35.7)
MCV: 90 fL (ref 79–97)
Platelets: 198 x10E3/uL (ref 150–450)
RBC: 5.39 x10E6/uL (ref 4.14–5.80)
RDW: 13.7 % (ref 11.6–15.4)
WBC: 6.2 x10E3/uL (ref 3.4–10.8)

## 2024-04-25 LAB — TESTOSTERONE, FREE, TOTAL, SHBG
Sex Hormone Binding: 22.3 nmol/L (ref 19.3–76.4)
Testosterone, Free: 21.9 pg/mL — AB (ref 6.6–18.1)
Testosterone: 775 ng/dL (ref 264–916)

## 2024-04-25 LAB — PSA: Prostate Specific Ag, Serum: 3.9 ng/mL (ref 0.0–4.0)

## 2024-04-26 ENCOUNTER — Ambulatory Visit: Payer: Self-pay | Admitting: Family Medicine

## 2024-04-26 NOTE — Progress Notes (Signed)
 Hi Shae, total testosterone  level looks fantastic.  Free testosterone  is a little on the high end.  We definitely do not need to increase in fact we should technically go down a little bit.  I would recommend going down to 150 every 14 days or if you want to spread it a little closer we could do 150 every 12 days.  It is a little harder to track that way but we could certainly do that as well.

## 2024-04-28 ENCOUNTER — Ambulatory Visit

## 2024-04-28 VITALS — BP 133/65 | HR 55

## 2024-04-28 DIAGNOSIS — R7989 Other specified abnormal findings of blood chemistry: Secondary | ICD-10-CM

## 2024-04-28 NOTE — Progress Notes (Signed)
   Established Patient Office Visit  Subjective   Patient ID: Glenn Meyer, male    DOB: 05-12-1951  Age: 73 y.o. MRN: [id number]  Chief Complaint  Patient presents with   Hypogonadism    HPI  Glenn Meyer is here for a testosterone  injection. Denies chest pain, shortness of breath, headaches or mood changes.   ROS    Objective:     BP 133/65   Pulse (!) 55   SpO2 98%    Physical Exam   No results found for any visits on 04/28/24.    The 10-year ASCVD risk score (Arnett DK, et al., 2019) is: 22.2%    Assessment & Plan:  Testosterone  injection - Patient supplied testosterone . Patient advised he will need labs in 3-4 months. Patient tolerated injection well without complications. Patient advised to schedule next injection 14 days from today.    Problem List Items Addressed This Visit       Unprioritized   Low testosterone  - Primary    Return in about 2 weeks (around 05/12/2024) for testosterone  injection. SABRA Pear, Jon Mayor, CMA

## 2024-05-04 ENCOUNTER — Encounter: Payer: Self-pay | Admitting: Sports Medicine

## 2024-05-05 ENCOUNTER — Ambulatory Visit: Admitting: Sports Medicine

## 2024-05-14 ENCOUNTER — Ambulatory Visit (INDEPENDENT_AMBULATORY_CARE_PROVIDER_SITE_OTHER): Admitting: Family Medicine

## 2024-05-14 ENCOUNTER — Encounter (INDEPENDENT_AMBULATORY_CARE_PROVIDER_SITE_OTHER): Payer: Self-pay | Admitting: Sports Medicine

## 2024-05-14 VITALS — BP 130/71 | HR 54 | Ht 76.0 in | Wt 222.0 lb

## 2024-05-14 DIAGNOSIS — R7989 Other specified abnormal findings of blood chemistry: Secondary | ICD-10-CM

## 2024-05-14 DIAGNOSIS — E669 Obesity, unspecified: Secondary | ICD-10-CM | POA: Diagnosis not present

## 2024-05-14 MED ORDER — TIRZEPATIDE 10 MG/0.5ML ~~LOC~~ SOAJ: SUBCUTANEOUS | 11 refills | Status: DC

## 2024-05-14 MED ORDER — TESTOSTERONE CYPIONATE 200 MG/ML IM SOLN
200.0000 mg | Freq: Once | INTRAMUSCULAR | Status: AC
  Administered 2024-05-14: 200 mg via INTRAMUSCULAR

## 2024-05-14 NOTE — Assessment & Plan Note (Signed)
 Started tirzepatide , patient will see PCP for further management.

## 2024-05-14 NOTE — Progress Notes (Signed)
 Pt here for testosterone  injection no SOB,CP or mood swings. This is a pt supplied injection.  Injection given in RUOQ tolerated well.   Pt will RTC in @ weeks for next injection.

## 2024-05-14 NOTE — Progress Notes (Signed)
 Agree with documentation as above.   Nani Gasser, MD

## 2024-05-14 NOTE — Telephone Encounter (Signed)

## 2024-05-26 ENCOUNTER — Ambulatory Visit (INDEPENDENT_AMBULATORY_CARE_PROVIDER_SITE_OTHER)

## 2024-05-26 VITALS — BP 132/69 | HR 57 | Ht 76.0 in | Wt 221.0 lb

## 2024-05-26 DIAGNOSIS — E291 Testicular hypofunction: Secondary | ICD-10-CM

## 2024-05-26 DIAGNOSIS — R7989 Other specified abnormal findings of blood chemistry: Secondary | ICD-10-CM | POA: Diagnosis not present

## 2024-05-26 NOTE — Progress Notes (Unsigned)
 Patient is here for a testosterone  injection of 200mg /ml.  Given in LUOQ.  Denies chest pain, shortness of breath, headaches and problems with medication or mood changes. Tolerated injection well without complications.   Patient advised to schedule next injection in 14 days. Patient will use CLINIC provided testosterone  going forward.

## 2024-05-27 ENCOUNTER — Encounter: Payer: Self-pay | Admitting: Family Medicine

## 2024-06-08 ENCOUNTER — Other Ambulatory Visit: Payer: Self-pay | Admitting: Family Medicine

## 2024-06-08 DIAGNOSIS — I10 Essential (primary) hypertension: Secondary | ICD-10-CM

## 2024-06-09 ENCOUNTER — Ambulatory Visit (INDEPENDENT_AMBULATORY_CARE_PROVIDER_SITE_OTHER): Payer: Medicare HMO | Admitting: Family Medicine

## 2024-06-09 ENCOUNTER — Encounter: Payer: Self-pay | Admitting: Family Medicine

## 2024-06-09 VITALS — BP 135/65 | HR 53 | Ht 76.0 in | Wt 216.1 lb

## 2024-06-09 DIAGNOSIS — N401 Enlarged prostate with lower urinary tract symptoms: Secondary | ICD-10-CM

## 2024-06-09 DIAGNOSIS — I10 Essential (primary) hypertension: Secondary | ICD-10-CM

## 2024-06-09 DIAGNOSIS — N529 Male erectile dysfunction, unspecified: Secondary | ICD-10-CM

## 2024-06-09 DIAGNOSIS — R351 Nocturia: Secondary | ICD-10-CM | POA: Diagnosis not present

## 2024-06-09 DIAGNOSIS — E785 Hyperlipidemia, unspecified: Secondary | ICD-10-CM

## 2024-06-09 DIAGNOSIS — E291 Testicular hypofunction: Secondary | ICD-10-CM

## 2024-06-09 MED ORDER — TESTOSTERONE CYPIONATE 200 MG/ML IM SOLN
200.0000 mg | INTRAMUSCULAR | Status: DC
  Administered 2024-06-09 – 2024-06-26 (×2): 200 mg via INTRAMUSCULAR

## 2024-06-09 MED ORDER — VARDENAFIL HCL 10 MG PO TABS: 10.0000 mg | ORAL_TABLET | Freq: Every day | ORAL | 0 refills | Status: DC | PRN

## 2024-06-09 NOTE — Progress Notes (Signed)
 Established Patient Office Visit  Subjective  Patient ID: Glenn Meyer, male    DOB: 11-20-1950  Age: 73 y.o. MRN: [id number]  Chief Complaint  Patient presents with   Hypertension   hypogonadism male    HPI   Discussed the use of AI scribe software for clinical note transcription with the patient, who gave verbal consent to proceed.  History of Present Illness Glenn Meyer is a 73 year old male with hypertension who presents for a regular follow-up visit.  Hypertension and cardiovascular risk management - Blood pressure measured at 135/70 mmHg in clinic today, typically measures around 130/70 mmHg at home - Takes antihypertensive medication and rosuvastatin  every morning as part of routine - No chest pain, shortness of breath, or palpitations  Testosterone  supplementation and energy levels - Started testosterone  supplementation in May - Increased energy levels since initiation of testosterone  therapy - Able to work out more frequently - Testosterone  levels have stabilized with no significant spikes after injections - on 200mg  Q 14 days and last levels were slightly elevated.   Dietary and lifestyle modifications - Adopted lower carbohydrate diet, avoiding high-carb foods such as pasta and bread but not completely eliminating carbohydrates - Lost five pounds attributed to increased physical activity and dietary changes  Erectile dysfunction management - Takes daily tadalafil  (Cialis ) and occasional sildenafil  with good efficacy - Considering trial of generic vardenafil  (Levitra ) as an alternative  Stimulant use for energy - Expresses concern about inconsistent effects of Dyanavel for energy levels       ROS    Objective:     BP 135/65   Pulse (!) 53   Ht 6' 4 (1.93 m)   Wt 216 lb 1.9 oz (98 kg)   SpO2 99%   BMI 26.31 kg/m    Physical Exam Vitals and nursing note reviewed.  Constitutional:      Appearance: Normal appearance.  HENT:     Head:  Normocephalic and atraumatic.  Eyes:     Conjunctiva/sclera: Conjunctivae normal.  Cardiovascular:     Rate and Rhythm: Normal rate and regular rhythm.  Pulmonary:     Effort: Pulmonary effort is normal.     Breath sounds: Normal breath sounds.  Skin:    General: Skin is warm and dry.  Neurological:     Mental Status: He is alert.  Psychiatric:        Mood and Affect: Mood normal.      No results found for any visits on 06/09/24.    The 10-year ASCVD risk score (Arnett DK, et al., 2019) is: 22.7%    Assessment & Plan:   Problem List Items Addressed This Visit       Cardiovascular and Mediastinum   Hypertension - Primary   Well controlled. Continue current regimen. Follow up in  6 mo       Relevant Medications   vardenafil  (LEVITRA ) 10 MG tablet     Endocrine   Hypogonadism male   Testosterone  deficiency Testosterone  supplementation improved energy and well-being. Current regimen maintains consistent levels without significant spikes. Discussed avoiding over-medication to prevent suppression of natural production. - Schedule follow-up blood work in early October to assess testosterone  levels.      Relevant Medications   testosterone  cypionate (DEPOTESTOSTERONE CYPIONATE) injection 200 mg     Other   Hyperlipidemia   Relevant Medications   vardenafil  (LEVITRA ) 10 MG tablet   Erectile dysfunction   Consider ref to Urology in future.  Relevant Medications   vardenafil  (LEVITRA ) 10 MG tablet   BPH associated with nocturia   Would like to try Levitra  for PRN use since Viagra  only working 50% of time.    Current treatment with daily Cialis  and occasional sildenafil  effective but inconsistent. Discussed high-fat meals affecting absorption of medications, including Levitra . - Prescribe 10 tablets of generic Levitra  (vardenafil ) for trial.       Assessment & Plan Hypertension Blood pressure slightly elevated at 135/65 but generally well-controlled at  home.  Hyperlipidemia Adherent to daily rosuvastatin  with no concerns raised.    No follow-ups on file.    Dorothyann Byars, MD

## 2024-06-09 NOTE — Progress Notes (Signed)
 Pt here for testosterone  injection.  Denies any SOB,CP or mood swings.   Injection given in RUOQ, pt tolerated well.  He will RTC in 14 days for next injection.

## 2024-06-09 NOTE — Assessment & Plan Note (Addendum)
 Would like to try Levitra  for PRN use since Viagra  only working 50% of time.    Current treatment with daily Cialis  and occasional sildenafil  effective but inconsistent. Discussed high-fat meals affecting absorption of medications, including Levitra . - Prescribe 10 tablets of generic Levitra  (vardenafil ) for trial.

## 2024-06-09 NOTE — Assessment & Plan Note (Signed)
 Consider ref to Urology in future.

## 2024-06-09 NOTE — Assessment & Plan Note (Signed)
 Testosterone  deficiency Testosterone  supplementation improved energy and well-being. Current regimen maintains consistent levels without significant spikes. Discussed avoiding over-medication to prevent suppression of natural production. - Schedule follow-up blood work in early October to assess testosterone  levels.

## 2024-06-09 NOTE — Assessment & Plan Note (Signed)
 Well controlled. Continue current regimen. Follow up in  6 mo

## 2024-06-09 NOTE — Patient Instructions (Signed)
 Follow up in 2 weeks for next Testosterone  injection

## 2024-06-11 MED ORDER — VARDENAFIL HCL 20 MG PO TABS: 20.0000 mg | ORAL_TABLET | Freq: Every day | ORAL | 5 refills | Status: AC | PRN

## 2024-06-11 NOTE — Telephone Encounter (Signed)
 Meds ordered this encounter  Medications   vardenafil  (LEVITRA ) 20 MG tablet    Sig: Take 1 tablet (20 mg total) by mouth daily as needed for erectile dysfunction.    Dispense:  10 tablet    Refill:  5

## 2024-06-23 ENCOUNTER — Encounter: Payer: Self-pay | Admitting: Sports Medicine

## 2024-06-26 ENCOUNTER — Ambulatory Visit (INDEPENDENT_AMBULATORY_CARE_PROVIDER_SITE_OTHER): Admitting: Family Medicine

## 2024-06-26 VITALS — BP 130/66 | HR 52 | Ht 76.0 in | Wt 213.0 lb

## 2024-06-26 DIAGNOSIS — E291 Testicular hypofunction: Secondary | ICD-10-CM | POA: Diagnosis not present

## 2024-06-26 DIAGNOSIS — Z23 Encounter for immunization: Secondary | ICD-10-CM

## 2024-06-26 MED ORDER — TESTOSTERONE CYPIONATE 200 MG/ML IM SOLN
200.0000 mg | INTRAMUSCULAR | Status: DC
  Administered 2024-07-10: 200 mg via INTRAMUSCULAR

## 2024-06-26 NOTE — Progress Notes (Signed)
 Patient is in office today for a nurse visit for Testosterone  Injection and Immunization. Patient Injection was given in the  Left upper quad. gluteus. Patient tolerated injection well. He will RTC in 2 weeks for next injection. While he was here he was given High dose Flu.

## 2024-06-26 NOTE — Progress Notes (Signed)
 Agree with documentation as above.   Nani Gasser, MD

## 2024-06-29 ENCOUNTER — Encounter: Payer: Self-pay | Admitting: Sports Medicine

## 2024-06-29 ENCOUNTER — Ambulatory Visit: Admitting: Sports Medicine

## 2024-06-29 VITALS — BP 124/72 | Ht 76.0 in | Wt 213.0 lb

## 2024-06-29 DIAGNOSIS — M5136 Other intervertebral disc degeneration, lumbar region with discogenic back pain only: Secondary | ICD-10-CM

## 2024-06-29 NOTE — Progress Notes (Signed)
   Subjective:    Patient ID: Glenn Meyer, male    DOB: Feb 22, 1951, 73 y.o.   MRN: [id number]  HPI chief complaint: Left hip pain  Glenn Meyer is a very pleasant 73 year old male that presents today with approximately 6 weeks of posterior left hip pain.  Pain is present when sitting on hard surfaces.  Yesterday, while at lunch, he began to have pain after 20 minutes of sitting.  Pain will improve with standing and walking.  He does endorse some numbness and tingling into his foot at times as well.  He denies any groin pain.  He is status post left total hip arthroplasty and has done very well postoperatively.  He is very active and enjoys working out.  X-rays of his lumbar spine done in June demonstrate moderate to severe degenerative changes.    Review of Systems As above    Objective:   Physical Exam  Well-developed, fit appearing.  No acute distress  Lumbar spine: There is tenderness to palpation along the posterior left hip along the area of the piriformis.  No tenderness at the origin of the hamstring tendon.  Smooth painless hip range of motion.  Neurological exam shows equal reflexes at the Achilles and patellar tendons.  Strength is 5/5 in both lower extremities.  No atrophy.      Assessment & Plan:   Posterior left hip pain-question lumbar radiculopathy from degenerative disc disease  I recommend a trial of physical therapy.  He may wean to a home exercise program per the therapist's discretion.  He may also try 400 to 600 mg of ibuprofen  30 minutes prior to any activity requiring prolonged sitting.  I see no reason why he cannot continue to be active and continue his current exercise routine.  He will follow-up for ongoing or recalcitrant issues.  This note was dictated using Dragon naturally speaking software and may contain errors in syntax, spelling, or content which have not been identified prior to signing this note.

## 2024-07-10 ENCOUNTER — Encounter: Payer: Self-pay | Admitting: Family Medicine

## 2024-07-10 ENCOUNTER — Ambulatory Visit (INDEPENDENT_AMBULATORY_CARE_PROVIDER_SITE_OTHER): Admitting: Family Medicine

## 2024-07-10 VITALS — BP 133/60 | HR 57 | Ht 76.0 in | Wt 209.0 lb

## 2024-07-10 DIAGNOSIS — E291 Testicular hypofunction: Secondary | ICD-10-CM | POA: Diagnosis not present

## 2024-07-10 DIAGNOSIS — Z125 Encounter for screening for malignant neoplasm of prostate: Secondary | ICD-10-CM

## 2024-07-10 DIAGNOSIS — E785 Hyperlipidemia, unspecified: Secondary | ICD-10-CM

## 2024-07-10 NOTE — Progress Notes (Addendum)
 Agree with documentation as above.   Nani Gasser, MD

## 2024-07-10 NOTE — Progress Notes (Signed)
 Patient is in office today for a nurse visit for Testosterone  Injection. Patient denies chest pain or shortness of breath. He also denies any problems with the medication. Patient Injection was given in the  Right upper quad. gluteus. Patient tolerated injection well.  Patient requests to come in on 07/17/24 to have his labs rechecked (testosterone , PSA, and cholesterol). This will be midway between testosterone  injections.

## 2024-07-15 DIAGNOSIS — M545 Low back pain, unspecified: Secondary | ICD-10-CM | POA: Diagnosis not present

## 2024-07-17 DIAGNOSIS — Z125 Encounter for screening for malignant neoplasm of prostate: Secondary | ICD-10-CM | POA: Diagnosis not present

## 2024-07-17 DIAGNOSIS — E785 Hyperlipidemia, unspecified: Secondary | ICD-10-CM | POA: Diagnosis not present

## 2024-07-17 DIAGNOSIS — E291 Testicular hypofunction: Secondary | ICD-10-CM | POA: Diagnosis not present

## 2024-07-20 LAB — LIPID PANEL
Chol/HDL Ratio: 2.4 ratio (ref 0.0–5.0)
Cholesterol, Total: 98 mg/dL — ABNORMAL LOW (ref 100–199)
HDL: 41 mg/dL (ref >39)
LDL Chol Calc (NIH): 46 mg/dL (ref 0–99)
Triglycerides: 39 mg/dL (ref 0–149)
VLDL Cholesterol Cal: 11 mg/dL (ref 5–40)

## 2024-07-20 LAB — TESTOSTERONE,FREE AND TOTAL
Testosterone, Free: 30.2 pg/mL — AB (ref 6.6–18.1)
Testosterone: 1178 ng/dL — ABNORMAL HIGH (ref 264–916)

## 2024-07-20 LAB — PSA: Prostate Specific Ag, Serum: 3.5 ng/mL (ref 0.0–4.0)

## 2024-07-20 NOTE — Telephone Encounter (Signed)
 Pt notified through Mychart results are still pending Dr. Claudia review. Annabella Rigg, CMA

## 2024-07-21 ENCOUNTER — Encounter: Payer: Self-pay | Admitting: Family Medicine

## 2024-07-21 ENCOUNTER — Ambulatory Visit: Payer: Self-pay | Admitting: Family Medicine

## 2024-07-21 NOTE — Telephone Encounter (Signed)
 Responsded on MyChart

## 2024-07-21 NOTE — Progress Notes (Signed)
 Cholesterol looks awesome!!!!!  Levels are high for testosterone .  OK to still come in Friday but will given 150mg  instead of 200mg .

## 2024-07-22 DIAGNOSIS — M545 Low back pain, unspecified: Secondary | ICD-10-CM | POA: Diagnosis not present

## 2024-07-24 ENCOUNTER — Encounter: Payer: Self-pay | Admitting: Family Medicine

## 2024-07-24 ENCOUNTER — Ambulatory Visit (INDEPENDENT_AMBULATORY_CARE_PROVIDER_SITE_OTHER)

## 2024-07-24 VITALS — BP 124/73 | HR 55 | Resp 17 | Ht 76.0 in | Wt 208.0 lb

## 2024-07-24 DIAGNOSIS — E291 Testicular hypofunction: Secondary | ICD-10-CM | POA: Diagnosis not present

## 2024-07-24 MED ORDER — TESTOSTERONE CYPIONATE 200 MG/ML IM SOLN
150.0000 mg | INTRAMUSCULAR | Status: AC
  Administered 2024-07-24 – 2024-11-25 (×8): 150 mg via INTRAMUSCULAR

## 2024-07-24 NOTE — Progress Notes (Addendum)
 Patient is in office today for a nurse visit for Testosterone  Injection. As of 07/21/24 patients dose has changed from 200mg  to 150mg  per Dr. Alvan Patient denies chest pain or shortness of breath. He also denies any problems with the medication. Patient Injection was given in the LUOQ. Patient tolerated injection well. No redness or swelling noted at the site. Patient advised to RTC in 14 days. (Around 08/07/24.

## 2024-07-25 ENCOUNTER — Encounter: Payer: Self-pay | Admitting: Family Medicine

## 2024-07-27 DIAGNOSIS — M545 Low back pain, unspecified: Secondary | ICD-10-CM | POA: Diagnosis not present

## 2024-07-28 ENCOUNTER — Telehealth: Payer: Self-pay

## 2024-07-28 ENCOUNTER — Other Ambulatory Visit (HOSPITAL_COMMUNITY): Payer: Self-pay

## 2024-07-28 NOTE — Telephone Encounter (Signed)
 A prior authorization is required for [facility]. Thanks in advance.

## 2024-07-28 NOTE — Telephone Encounter (Signed)
 Pharmacy Patient Advocate Encounter  Received notification from [employer] that Prior Authorization for Xyosted 100mg /0.18ml has been DENIED.  Full denial letter will be uploaded to the media tab. See denial reason below.   PA #/Case ID/Reference #: [id number]

## 2024-07-28 NOTE — Telephone Encounter (Signed)
 Pharmacy Patient Advocate Encounter   Received notification from Patient Advice Request messages that prior authorization for Xyosted 100mg /0.76ml is required/requested.   Insurance verification completed.   The patient is insured through [employer].   Per test claim: PA required; PA submitted to above mentioned insurance via Latent Key/confirmation #/EOC [id number] Status is pending

## 2024-08-03 DIAGNOSIS — M545 Low back pain, unspecified: Secondary | ICD-10-CM | POA: Diagnosis not present

## 2024-08-06 DIAGNOSIS — M545 Low back pain, unspecified: Secondary | ICD-10-CM | POA: Diagnosis not present

## 2024-08-07 ENCOUNTER — Ambulatory Visit (INDEPENDENT_AMBULATORY_CARE_PROVIDER_SITE_OTHER)

## 2024-08-07 VITALS — BP 126/70 | HR 70 | Resp 17 | Ht 76.0 in

## 2024-08-07 DIAGNOSIS — E291 Testicular hypofunction: Secondary | ICD-10-CM | POA: Diagnosis not present

## 2024-08-07 NOTE — Progress Notes (Signed)
 Patient is in office today for a nurse visit for Testosterone  Injection. Pt receives 150mg  every 2 weeks. Patient denies CP, SOB, mood changes, or medications changes or issues with taking medications. Patient Injection was given in the RUOQ. Patient tolerated injection well. No redness or swelling noted at the site. Patient advised to RTC in 14 days. (Around 08/21/24).

## 2024-08-15 DIAGNOSIS — M545 Low back pain, unspecified: Secondary | ICD-10-CM | POA: Diagnosis not present

## 2024-08-21 ENCOUNTER — Ambulatory Visit (INDEPENDENT_AMBULATORY_CARE_PROVIDER_SITE_OTHER)

## 2024-08-21 VITALS — BP 128/77 | HR 58 | Resp 18 | Ht 76.0 in

## 2024-08-21 DIAGNOSIS — E291 Testicular hypofunction: Secondary | ICD-10-CM

## 2024-08-21 NOTE — Progress Notes (Signed)
 Patient is in office today for a nurse visit for Testosterone  Injection. Pt receives 150mg  every 2 weeks. Patient denies CP, SOB, mood changes, or medications changes or issues with taking medications.   Patient Injection was given in the LUOQ. Patient tolerated injection well. No redness or swelling noted at the site. Patient advised to RTC in 14 days. (Around 09/20/24).

## 2024-08-25 ENCOUNTER — Encounter: Payer: Self-pay | Admitting: Family Medicine

## 2024-08-25 DIAGNOSIS — N401 Enlarged prostate with lower urinary tract symptoms: Secondary | ICD-10-CM

## 2024-08-25 MED ORDER — TADALAFIL 5 MG PO TABS
5.0000 mg | ORAL_TABLET | Freq: Every day | ORAL | 3 refills | Status: AC
Start: 2024-08-25 — End: ?

## 2024-08-25 NOTE — Telephone Encounter (Signed)
 Requesting refill of Tadalafil  5mg   Last written 11/04/2203 Last OV 06/09/2024 With note- Erectile dysfunction     Consider ref to Urology in future.      Upcoming appt = 09/04/2024

## 2024-09-03 NOTE — Progress Notes (Unsigned)
   Subjective:    Patient ID: Glenn Meyer, male    DOB: 1951/07/06, 73 y.o.   MRN: [id number]  HPI  Patient is in office today for a nurse visit for Testosterone  Injection. Pt receives 150mg  every 2 weeks. Patient denies CP, SOB, mood changes, or medications changes or issues with taking medications.                 Review of Systems     Objective:   Physical Exam        Assessment & Plan:   Patient Injection was given in the RUOQ. Patient tolerated injection well. No redness or swelling noted at the site. Patient advised to RTC in 14 days. (Around 09/20/24).

## 2024-09-04 ENCOUNTER — Ambulatory Visit

## 2024-09-04 VITALS — BP 140/72 | HR 56 | Resp 18 | Ht 76.0 in | Wt 209.0 lb

## 2024-09-04 DIAGNOSIS — E291 Testicular hypofunction: Secondary | ICD-10-CM

## 2024-09-15 NOTE — Progress Notes (Addendum)
   Subjective:    Patient ID: IRL BODIE, male    DOB: 24-Feb-1951, 73 y.o.   MRN: [id number]  HPI  Patient is here for his Testosterone  Injection. Pt receives 150mg  every 2 weeks. Patient denies CP, SOB, mood changes, or medications changes or issues with taking medications.     Review of Systems     Objective:   Physical Exam  Patient Injection was given in the LUOQ. Patient tolerated injection well. No redness or swelling noted at the site. Patient advised to RTC in 14 days. (Around 09/30/24)       Assessment & Plan:

## 2024-09-16 ENCOUNTER — Ambulatory Visit

## 2024-09-16 VITALS — BP 135/72 | HR 57 | Resp 18 | Ht 76.0 in | Wt 209.0 lb

## 2024-09-16 DIAGNOSIS — E291 Testicular hypofunction: Secondary | ICD-10-CM | POA: Diagnosis not present

## 2024-09-22 NOTE — Progress Notes (Signed)
 Per [employer] billing Glenn Meyer), unable to send in the charge for this visit. Routing to the provider to sign off on visit.

## 2024-09-28 ENCOUNTER — Encounter: Payer: Self-pay | Admitting: Family Medicine

## 2024-09-28 DIAGNOSIS — I10 Essential (primary) hypertension: Secondary | ICD-10-CM

## 2024-09-28 MED ORDER — AMLODIPINE BESYLATE 10 MG PO TABS: 10.0000 mg | ORAL_TABLET | Freq: Every day | ORAL | 0 refills | Status: AC

## 2024-09-30 ENCOUNTER — Ambulatory Visit (INDEPENDENT_AMBULATORY_CARE_PROVIDER_SITE_OTHER)

## 2024-09-30 VITALS — BP 131/75 | HR 55 | Resp 18 | Ht 76.0 in | Wt 209.0 lb

## 2024-09-30 DIAGNOSIS — E291 Testicular hypofunction: Secondary | ICD-10-CM | POA: Diagnosis not present

## 2024-09-30 NOTE — Progress Notes (Signed)
° °  Subjective:    Patient ID: Glenn Meyer, male    DOB: 1951-05-03, 73 y.o.   MRN: [id number]  HPI  Patient is here for his Testosterone  Injection. Pt receives 150mg  every 2 weeks. Patient denies CP, SOB, mood changes, or medications changes or issues with taking medications.  Review of Systems     Objective:   Physical Exam        Assessment & Plan:   Patient Injection was given in the RUOQ. Patient tolerated injection well. No redness or swelling noted at the site. Patient advised to RTC in 14 days. (Around 09/2324- 12/26) due to christmas holiday.

## 2024-10-02 DIAGNOSIS — H35361 Drusen (degenerative) of macula, right eye: Secondary | ICD-10-CM | POA: Diagnosis not present

## 2024-10-02 DIAGNOSIS — H2513 Age-related nuclear cataract, bilateral: Secondary | ICD-10-CM | POA: Diagnosis not present

## 2024-10-02 DIAGNOSIS — H524 Presbyopia: Secondary | ICD-10-CM | POA: Diagnosis not present

## 2024-10-13 ENCOUNTER — Ambulatory Visit

## 2024-10-16 ENCOUNTER — Ambulatory Visit (INDEPENDENT_AMBULATORY_CARE_PROVIDER_SITE_OTHER)

## 2024-10-16 VITALS — BP 137/77 | HR 56 | Resp 18 | Ht 76.0 in | Wt 209.0 lb

## 2024-10-16 DIAGNOSIS — E291 Testicular hypofunction: Secondary | ICD-10-CM | POA: Diagnosis not present

## 2024-10-16 NOTE — Progress Notes (Signed)
" ° °  Subjective:    Patient ID: Glenn Meyer, male    DOB: 30-Aug-1951, 73 y.o.   MRN: [id number]  HPI  Patient is here for his Testosterone  Injection. Pt receives 150mg  every 2 weeks. Patient denies CP, SOB, mood changes, or medications changes or issues with taking medications.   Review of Systems     Objective:   Physical Exam        Assessment & Plan:   Patient Injection was given in the LUOQ. Patient tolerated injection well. No redness or swelling noted at the site. Patient advised to RTC in 14 days. (Around 10/30/24)  "

## 2024-10-29 NOTE — Progress Notes (Signed)
" ° °  Subjective:    Patient ID: Glenn Meyer, male    DOB: 1951/07/23, 74 y.o.   MRN: [id number]  HPI  Patient is here for his Testosterone  Injection. Pt receives 150mg  every 2 weeks. Patient denies CP, SOB, mood changes, or medications changes or issues with taking medications.   Review of Systems     Objective:   Physical Exam        Assessment & Plan:   Patient Injection was given in the RUOQ. Patient tolerated injection well. No redness or swelling noted at the site. Patient advised to RTC in 14 days. (Around 11/13/24)  "

## 2024-10-30 ENCOUNTER — Ambulatory Visit (INDEPENDENT_AMBULATORY_CARE_PROVIDER_SITE_OTHER)

## 2024-10-30 VITALS — BP 131/63 | HR 55 | Resp 19 | Ht 76.0 in | Wt 209.0 lb

## 2024-10-30 DIAGNOSIS — E291 Testicular hypofunction: Secondary | ICD-10-CM

## 2024-11-03 ENCOUNTER — Encounter: Payer: Self-pay | Admitting: Family Medicine

## 2024-11-11 ENCOUNTER — Ambulatory Visit (INDEPENDENT_AMBULATORY_CARE_PROVIDER_SITE_OTHER)

## 2024-11-11 VITALS — BP 129/74 | HR 53 | Resp 20 | Ht 76.0 in

## 2024-11-11 DIAGNOSIS — E291 Testicular hypofunction: Secondary | ICD-10-CM

## 2024-11-11 NOTE — Progress Notes (Signed)
" ° °  Subjective:    Patient ID: Glenn Meyer, male    DOB: 1951/06/21, 74 y.o.   MRN: [id number]  HPI  Patient is here for his Testosterone  Injection. Pt receives 150mg  every 2 weeks. Patient denies CP, SOB, mood changes, or medications changes or issues with taking medications.   Review of Systems     Objective:   Physical Exam        Assessment & Plan:   Patient Injection was given in the LUOQ. Patient tolerated injection well. No redness or swelling noted at the site. Patient advised to RTC in 14 days. (Around 11/25/24)  "

## 2024-11-13 ENCOUNTER — Ambulatory Visit

## 2024-11-20 ENCOUNTER — Ambulatory Visit
Admission: RE | Admit: 2024-11-20 | Discharge: 2024-11-20 | Disposition: A | Discharge status: Home / Self Care | Attending: Family Medicine | Admitting: Family Medicine

## 2024-11-20 VITALS — BP 135/78 | HR 88 | Temp 99.0°F | Resp 16

## 2024-11-20 DIAGNOSIS — J069 Acute upper respiratory infection, unspecified: Secondary | ICD-10-CM

## 2024-11-20 MED ORDER — AZITHROMYCIN 250 MG PO TABS: ORAL_TABLET | ORAL | 0 refills | Status: AC

## 2024-11-20 NOTE — ED Triage Notes (Signed)
 Pt has c/o cough, body aches, sinus drainage, and fatigue x 5 days. Pt has been taking Theraflu  with little relief. Last dose yesterday.

## 2024-11-20 NOTE — Discharge Instructions (Signed)
 Take plain guaifenesin  (1200mg  extended release tabs such as Mucinex ) twice daily, with plenty of water, for cough and congestion.  Get adequate rest.   May use Afrin nasal spray (or generic oxymetazoline) each morning for about 5 days and then discontinue.  Also recommend using saline nasal spray several times daily and saline nasal irrigation (AYR is a common brand).  Use Flonase nasal spray each morning after using Afrin nasal spray and saline nasal irrigation. Try warm salt water gargles for sore throat.  Stop all antihistamines for now, and other non-prescription cough/cold preparations. May take Delsym Cough Suppressant (12 Hour Cough Relief) at bedtime for nighttime cough.  Begin Azithromycin  if not improving about 4 to 5 or if persistent fever develops   If symptoms become significantly worse during the night or over the weekend, proceed to the local emergency room.

## 2024-11-20 NOTE — ED Provider Notes (Signed)
 " Glenn Meyer    CSN: [id number] Arrival date & time: 11/20/24  9071      History   Chief Complaint Chief Complaint  Patient presents with   Cough    Body aches  fatigue and sinus drainage. - Entered by patient   Facial Pain   Generalized Body Aches   Fatigue    HPI Glenn Meyer is a 74 y.o. male.   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 years   Tinnitus    TMJ (dislocation of temporomandibular joint)     Patient Active Problem List   Diagnosis Date Noted   Obesity 05/14/2024   Erectile dysfunction 12/11/2023   Lumbar degenerative disc disease 04/12/2021   Hypogonadism male 12/29/2020   Tinnitus    Hypertension    BPH associated with nocturia 07/19/2020   Hyperlipidemia 08/08/2007   GERD 07/30/2007    Past Surgical History:  Procedure Laterality Date   EYE SURGERY  2021   Torn retina   JOINT REPLACEMENT  10/25/21   TONSILECTOMY, ADENOIDECTOMY, BILATERAL MYRINGOTOMY AND TUBES     TONSILLECTOMY     age 43   TOTAL HIP ARTHROPLASTY Left 10/25/2021       Home Medications    Prior to Admission medications  Medication Sig Start Date End Date Taking? Authorizing Provider  azithromycin  (ZITHROMAX  Z-PAK) 250 MG tablet Take 2 tabs today; then begin one tab once daily for 4 more days. 11/20/24  Yes Pauline Garnette LABOR, MD  amLODipine  (NORVASC ) 10 MG tablet Take 1 tablet (10 mg total) by mouth daily. 09/28/24   Alvan Dorothyann BIRCH, MD  esomeprazole  (NEXIUM ) 40 MG capsule Take 40 mg by mouth daily at 12 noon.    [provider]  L-ARGININE PO Take by mouth.    [provider]  Multiple Vitamin (MULTIVITAMIN) capsule Take 1 capsule by mouth daily.    [provider]  rosuvastatin  (CRESTOR ) 20 MG tablet Take 1 tablet (20 mg total) by  mouth daily. 01/31/24   Alvan Dorothyann BIRCH, MD  tadalafil  (CIALIS ) 5 MG tablet Take 1 tablet (5 mg total) by mouth daily. 08/25/24   Alvan Dorothyann BIRCH, MD  TURMERIC PO Take by mouth.    [provider]  vardenafil  (LEVITRA ) 20 MG tablet Take 1 tablet (20 mg total) by mouth daily as needed for erectile dysfunction. 06/11/24   Alvan Dorothyann BIRCH, MD    Family History Family History  Problem Relation Age of Onset   Hypertension Mother    Hyperlipidemia Mother    Kidney disease Mother        congenital/diaylisis 42's   Heart disease Father        bypass 2000   Hypertension Other    Hyperlipidemia Other     Social History Social History[1]   Allergies   Lisinopril-hydrochlorothiazide and Meperidine hcl   Review of Systems Review of Systems   Physical Exam Triage Vital Signs ED Triage Vitals [11/20/24 0946]  Encounter Vitals Group     BP 135/78     Girls Systolic BP Percentile      Girls Diastolic BP Percentile      Boys Systolic BP Percentile      Boys Diastolic BP Percentile  Pulse Rate 88     Resp 16     Temp 99 F (37.2 C)     Temp Source Oral     SpO2 98 %     Weight      Height      Head Circumference      Peak Flow      Pain Score      Pain Loc      Pain Education      Exclude from Growth Chart    No data found.  Updated Vital Signs BP 135/78 (BP Location: Right Arm)   Pulse 88   Temp 99 F (37.2 C) (Oral)   Resp 16   SpO2 98%   Visual Acuity Right Eye Distance:   Left Eye Distance:   Bilateral Distance:    Right Eye Near:   Left Eye Near:    Bilateral Near:     Physical Exam   UC Treatments / Results  Labs (all labs ordered are listed, but only abnormal results are displayed) Labs Reviewed - No data to display  EKG   Radiology No results found.  Procedures Procedures (including critical Meyer time)  Medications Ordered in UC Medications - No data to display  Initial Impression / Assessment and Plan / UC  Course  I have reviewed the triage vital signs and the nursing notes.  Pertinent labs & imaging results that were available during my Meyer of the patient were reviewed by me and considered in my medical decision making (see chart for details).    There is no evidence of bacterial infection today.  Treat symptomatically for now  Because of his history of pneumonia, will give Rx for Z-pack to hold, with an expiration date. Followup with Family Doctor if not improved in one week.   Final Clinical Impressions(s) / UC Diagnoses   Final diagnoses:  Viral URI with cough     Discharge Instructions      Take plain guaifenesin  (1200mg  extended release tabs such as Mucinex ) twice daily, with plenty of water, for cough and congestion.  Get adequate rest.   May use Afrin nasal spray (or generic oxymetazoline) each morning for about 5 days and then discontinue.  Also recommend using saline nasal spray several times daily and saline nasal irrigation (AYR is a common brand).  Use Flonase nasal spray each morning after using Afrin nasal spray and saline nasal irrigation. Try warm salt water gargles for sore throat.  Stop all antihistamines for now, and other non-prescription cough/cold preparations. May take Delsym Cough Suppressant (12 Hour Cough Relief) at bedtime for nighttime cough.  Begin Azithromycin  if not improving about 4 to 5 or if persistent fever develops   If symptoms become significantly worse during the night or over the weekend, proceed to the local emergency room.     ED Prescriptions     Medication Sig Dispense Auth. Provider   azithromycin  (ZITHROMAX  Z-PAK) 250 MG tablet Take 2 tabs today; then begin one tab once daily for 4 more days. 6 tablet Pauline Garnette LABOR, MD      PDMP not reviewed this encounter.    [1]  Social History Tobacco Use   Smoking status: Former    Current packs/day: 0.00    Types: Cigarettes    Start date: 10/22/1969    Quit date: 10/22/1972    Years  since quitting: 52.1   Smokeless tobacco: Never   Tobacco comments:    quit 34 years ago  Vaping  Use   Vaping status: Never Used  Substance Use Topics   Alcohol  use: Yes    Alcohol /week: 3.0 standard drinks of alcohol     Types: 3 Cans of beer per week    Comment: per week   Drug use: No   "

## 2024-11-25 ENCOUNTER — Ambulatory Visit (INDEPENDENT_AMBULATORY_CARE_PROVIDER_SITE_OTHER)

## 2024-11-25 VITALS — BP 132/69 | HR 54 | Resp 18 | Ht 76.0 in

## 2024-11-25 DIAGNOSIS — E291 Testicular hypofunction: Secondary | ICD-10-CM

## 2024-11-25 NOTE — Progress Notes (Signed)
" ° °  Subjective:    Patient ID: Glenn Meyer, male    DOB: 18-Nov-1950, 74 y.o.   MRN: [id number]  HPI  Patient is here for his Testosterone  Injection. Pt receives 150mg  every 2 weeks. Patient denies CP, SOB, mood changes, or medications changes or issues with taking medications.   Review of Systems     Objective:   Physical Exam        Assessment & Plan:   Patient Injection was given in the RUOQ. Patient tolerated injection well. No redness or swelling noted at the site. Patient advised to RTC in 14 days. (Around 12/09/24)  "

## 2024-12-09 ENCOUNTER — Ambulatory Visit
